# Patient Record
Sex: Female | Born: 1960
Health system: Southern US, Community
[De-identification: ages and names within clinical notes are randomized; demographics above are authoritative.]

## PROBLEM LIST (undated history)

## (undated) DIAGNOSIS — E079 Disorder of thyroid, unspecified: Secondary | ICD-10-CM

## (undated) DIAGNOSIS — F419 Anxiety disorder, unspecified: Secondary | ICD-10-CM

## (undated) DIAGNOSIS — E039 Hypothyroidism, unspecified: Secondary | ICD-10-CM

## (undated) DIAGNOSIS — F32A Depression, unspecified: Secondary | ICD-10-CM

## (undated) DIAGNOSIS — N189 Chronic kidney disease, unspecified: Secondary | ICD-10-CM

## (undated) DIAGNOSIS — F329 Major depressive disorder, single episode, unspecified: Secondary | ICD-10-CM

## (undated) DIAGNOSIS — K219 Gastro-esophageal reflux disease without esophagitis: Secondary | ICD-10-CM

## (undated) DIAGNOSIS — T7840XA Allergy, unspecified, initial encounter: Secondary | ICD-10-CM

## (undated) DIAGNOSIS — I10 Essential (primary) hypertension: Secondary | ICD-10-CM

## (undated) DIAGNOSIS — N281 Cyst of kidney, acquired: Secondary | ICD-10-CM

## (undated) DIAGNOSIS — M1990 Unspecified osteoarthritis, unspecified site: Secondary | ICD-10-CM

## (undated) HISTORY — PX: ABDOMINAL HYSTERECTOMY: SHX81

## (undated) HISTORY — PX: CHOLECYSTECTOMY: SHX55

## (undated) HISTORY — PX: JOINT REPLACEMENT: SHX530

---
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
## 1995-05-05 HISTORY — PX: BREAST BIOPSY: SHX20

## 2005-02-10 ENCOUNTER — Ambulatory Visit: Payer: Self-pay | Admitting: Family Medicine

## 2006-02-16 ENCOUNTER — Ambulatory Visit: Payer: Self-pay | Admitting: Family Medicine

## 2008-01-23 ENCOUNTER — Emergency Department: Payer: Self-pay | Admitting: Emergency Medicine

## 2008-01-23 ENCOUNTER — Other Ambulatory Visit: Payer: Self-pay

## 2008-02-15 ENCOUNTER — Ambulatory Visit: Payer: Self-pay | Admitting: Family Medicine

## 2009-02-21 ENCOUNTER — Ambulatory Visit: Payer: Self-pay | Admitting: Family Medicine

## 2011-10-05 ENCOUNTER — Ambulatory Visit: Payer: Self-pay | Admitting: Family

## 2014-08-19 ENCOUNTER — Ambulatory Visit
Admit: 2014-08-19 | Disposition: A | Discharge status: Home / Self Care | Payer: Self-pay | Attending: Family Medicine | Admitting: Family Medicine

## 2018-05-31 DIAGNOSIS — M109 Gout, unspecified: Secondary | ICD-10-CM | POA: Diagnosis not present

## 2018-05-31 DIAGNOSIS — E039 Hypothyroidism, unspecified: Secondary | ICD-10-CM | POA: Diagnosis not present

## 2018-05-31 DIAGNOSIS — Z1159 Encounter for screening for other viral diseases: Secondary | ICD-10-CM | POA: Diagnosis not present

## 2018-05-31 DIAGNOSIS — N183 Chronic kidney disease, stage 3 (moderate): Secondary | ICD-10-CM | POA: Diagnosis not present

## 2018-11-13 ENCOUNTER — Other Ambulatory Visit: Payer: Self-pay

## 2018-11-13 ENCOUNTER — Emergency Department
Admission: EM | Admit: 2018-11-13 | Discharge: 2018-11-13 | Disposition: A | Discharge status: Home / Self Care | Payer: 59 | Attending: Emergency Medicine | Admitting: Emergency Medicine

## 2018-11-13 DIAGNOSIS — E860 Dehydration: Secondary | ICD-10-CM

## 2018-11-13 DIAGNOSIS — R55 Syncope and collapse: Secondary | ICD-10-CM | POA: Diagnosis not present

## 2018-11-13 DIAGNOSIS — E039 Hypothyroidism, unspecified: Secondary | ICD-10-CM | POA: Insufficient documentation

## 2018-11-13 HISTORY — DX: Hypothyroidism, unspecified: E03.9

## 2018-11-13 LAB — CBC WITH DIFFERENTIAL/PLATELET
Abs Immature Granulocytes: 0.02 10*3/uL (ref 0.00–0.07)
Basophils Absolute: 0 10*3/uL (ref 0.0–0.1)
Basophils Relative: 1 %
Eosinophils Absolute: 0.1 10*3/uL (ref 0.0–0.5)
Eosinophils Relative: 1 %
HCT: 40.3 % (ref 36.0–46.0)
Hemoglobin: 13.5 g/dL (ref 12.0–15.0)
Immature Granulocytes: 0 %
Lymphocytes Relative: 41 %
Lymphs Abs: 2.4 10*3/uL (ref 0.7–4.0)
MCH: 31.7 pg (ref 26.0–34.0)
MCHC: 33.5 g/dL (ref 30.0–36.0)
MCV: 94.6 fL (ref 80.0–100.0)
Monocytes Absolute: 0.5 10*3/uL (ref 0.1–1.0)
Monocytes Relative: 9 %
Neutro Abs: 2.8 10*3/uL (ref 1.7–7.7)
Neutrophils Relative %: 48 %
Platelets: 245 10*3/uL (ref 150–400)
RBC: 4.26 MIL/uL (ref 3.87–5.11)
RDW: 12 % (ref 11.5–15.5)
WBC: 5.8 10*3/uL (ref 4.0–10.5)
nRBC: 0 % (ref 0.0–0.2)

## 2018-11-13 LAB — COMPREHENSIVE METABOLIC PANEL
ALT: 20 U/L (ref 0–44)
AST: 19 U/L (ref 15–41)
Albumin: 4.6 g/dL (ref 3.5–5.0)
Alkaline Phosphatase: 83 U/L (ref 38–126)
Anion gap: 6 (ref 5–15)
BUN: 27 mg/dL — ABNORMAL HIGH (ref 6–20)
CO2: 27 mmol/L (ref 22–32)
Calcium: 9.4 mg/dL (ref 8.9–10.3)
Chloride: 105 mmol/L (ref 98–111)
Creatinine, Ser: 1.41 mg/dL — ABNORMAL HIGH (ref 0.44–1.00)
GFR calc Af Amer: 48 mL/min — ABNORMAL LOW (ref >60)
GFR calc non Af Amer: 42 mL/min — ABNORMAL LOW (ref >60)
Glucose, Bld: 110 mg/dL — ABNORMAL HIGH (ref 70–99)
Potassium: 4.9 mmol/L (ref 3.5–5.1)
Sodium: 138 mmol/L (ref 135–145)
Total Bilirubin: 0.7 mg/dL (ref 0.3–1.2)
Total Protein: 7.8 g/dL (ref 6.5–8.1)

## 2018-11-13 MED ORDER — SODIUM CHLORIDE 0.9 % IV SOLN
1000.0000 mL | Freq: Once | INTRAVENOUS | Status: AC
  Administered 2018-11-13: 1000 mL via INTRAVENOUS

## 2018-11-13 NOTE — ED Notes (Signed)
Pt up to use bathroom at this time. 

## 2018-11-13 NOTE — ED Triage Notes (Addendum)
Pt was at work and had a syncopal episode. Denies being diabetic. Denies drinking or eating today. Hx hypothyroidism, HTN. Came to quickly. Landed on floor, on back- hit a chair first and then laid on floor, c/o HA and neck tightness. Denies blood thinner use. A&O at this time- back to baseline.   CBG 93.

## 2018-11-13 NOTE — Discharge Instructions (Addendum)
Please drink lots of fluids and rest today
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
## 2018-11-14 LAB — GLUCOSE, CAPILLARY: Glucose-Capillary: 93 mg/dL (ref 70–99)

## 2018-11-30 ENCOUNTER — Emergency Department
Admission: EM | Admit: 2018-11-30 | Discharge: 2018-11-30 | Disposition: A | Discharge status: Home / Self Care | Payer: 59 | Attending: Emergency Medicine | Admitting: Emergency Medicine

## 2018-11-30 ENCOUNTER — Encounter: Payer: Self-pay | Admitting: Emergency Medicine

## 2018-11-30 ENCOUNTER — Emergency Department: Payer: 59

## 2018-11-30 ENCOUNTER — Other Ambulatory Visit: Payer: Self-pay

## 2018-11-30 DIAGNOSIS — M19071 Primary osteoarthritis, right ankle and foot: Secondary | ICD-10-CM | POA: Diagnosis not present

## 2018-11-30 DIAGNOSIS — E039 Hypothyroidism, unspecified: Secondary | ICD-10-CM | POA: Diagnosis not present

## 2018-11-30 DIAGNOSIS — M7731 Calcaneal spur, right foot: Secondary | ICD-10-CM | POA: Insufficient documentation

## 2018-11-30 DIAGNOSIS — Z79899 Other long term (current) drug therapy: Secondary | ICD-10-CM | POA: Insufficient documentation

## 2018-11-30 DIAGNOSIS — M79671 Pain in right foot: Secondary | ICD-10-CM | POA: Insufficient documentation

## 2018-11-30 MED ORDER — ONDANSETRON HCL 4 MG PO TABS: 4.0000 mg | ORAL_TABLET | Freq: Three times a day (TID) | ORAL | 0 refills | Status: DC | PRN

## 2018-11-30 MED ORDER — TRAMADOL HCL 50 MG PO TABS: 50.0000 mg | ORAL_TABLET | Freq: Four times a day (QID) | ORAL | 0 refills | Status: AC | PRN

## 2018-11-30 NOTE — ED Triage Notes (Signed)
Pt presents to ED via POV with c/o R foot pain. Pt states pain worse with walking at this time, pt ambulatory with slight limp at this time. Pt states no known injury at this time.

## 2018-11-30 NOTE — ED Provider Notes (Signed)
[facility] Emergency Department Provider Note  ____________________________________________  Time seen: Approximately 5:30 PM  I have reviewed the triage vital signs and the nursing notes.   HISTORY  Chief Complaint Foot Pain    HPI Sara Hebert is a 58 y.o. female with a history of hypothyroidism, presents to the emergency department with acute lateral right foot pain that started today.  Patient states that the pain intensified after standing for prolonged amount of time.  Patient localizes her pain along the proximal aspect of the fourth and fifth metatarsals.  She denies falls or traumas.  No inversion or eversion type injuries.  Patient has a history of pretibial myxedema and states that she has chronic swelling of the right foot and right ankle that is baseline for her.  She has not noticed any new redness or worsening swelling along the right foot and right calf.  No recent surgeries, recent travel or prolonged immobilization.  She has been able to ambulate with some pain.  Patient states that she cannot take anti-inflammatories for pain.  She has been taking Tylenol at home.        Past Medical History:  Diagnosis Date  . Hypothyroidism     There are no active problems to display for this patient.   Past Surgical History:  Procedure Laterality Date  . ABDOMINAL HYSTERECTOMY    . CHOLECYSTECTOMY      Prior to Admission medications   Medication Sig Start Date End Date Taking? Authorizing Provider  atenolol (TENORMIN) 100 MG tablet Take 100 mg by mouth daily.   Yes [provider]  buPROPion (WELLBUTRIN XL) 300 MG 24 hr tablet Take 300 mg by mouth daily.   Yes [provider]  levothyroxine (SYNTHROID) 150 MCG tablet Take 150 mcg by mouth daily before breakfast.   Yes [provider]  lisinopril (ZESTRIL) 20 MG tablet Take 20 mg by mouth daily.   Yes [provider]  ondansetron (ZOFRAN) 4 MG tablet  Take 1 tablet (4 mg total) by mouth every 8 (eight) hours as needed for nausea or vomiting. 11/30/18   Orvil FeilWoods, [name] M, PA-C  traMADol (ULTRAM) 50 MG tablet Take 1 tablet (50 mg total) by mouth every 6 (six) hours as needed for up to 3 days. 11/30/18 12/03/18  Orvil FeilWoods, Lovey Crupi M, PA-C    Allergies Patient has no known allergies.  No family history on file.  Social History Social History   Tobacco Use  . Smoking status: Never Smoker  Substance Use Topics  . Alcohol use: Not Currently    Frequency: Never  . Drug use: Not on file     Review of Systems  Constitutional: No fever/chills Eyes: No visual changes. No discharge ENT: No upper respiratory complaints. Cardiovascular: no chest pain. Respiratory: no cough. No SOB. Gastrointestinal: No abdominal pain.  No nausea, no vomiting.  No diarrhea.  No constipation. Musculoskeletal: Patient has right foot pain.  Skin: Negative for rash, abrasions, lacerations, ecchymosis. Neurological: Negative for headaches, focal weakness or numbness.   ____________________________________________   PHYSICAL EXAM:  VITAL SIGNS: ED Triage Vitals  Enc Vitals Group     BP 11/30/18 1556 (!) 129/56     Pulse Rate 11/30/18 1556 91     Resp 11/30/18 1556 16     Temp 11/30/18 1556 98.2 F (36.8 C)     Temp Source 11/30/18 1556 Oral     SpO2 11/30/18 1556 99 %     Weight 11/30/18 1554 240  lb (108.9 kg)     Height 11/30/18 1554 5\' 5"  (1.651 m)     Head Circumference --      Peak Flow --      Pain Score 11/30/18 1554 5     Pain Loc --      Pain Edu? --      Excl. in [place]? --      Constitutional: Alert and oriented. Well appearing and in no acute distress. Eyes: Conjunctivae are normal. PERRL. EOMI. Head: Atraumatic. Cardiovascular: Normal rate, regular rhythm. Normal S1 and S2.  Good peripheral circulation. Respiratory: Normal respiratory effort without tachypnea or retractions. Lungs CTAB. Good air entry to the bases with no decreased or absent  breath sounds. Musculoskeletal: Full range of motion to all extremities. No gross deformities appreciated.  Patient does not have reproducible pain to palpation along the fourth and fifth metatarsals.  No pain with metatarsal compression.  Patient does experience pain with weightbearing activities.  Palpable dorsalis pedis pulse, right Neurologic:  Normal speech and language. No gross focal neurologic deficits are appreciated.  Skin:  Skin is warm, dry and intact. No rash noted. Psychiatric: Mood and affect are normal. Speech and behavior are normal. Patient exhibits appropriate insight and judgement.   ____________________________________________   LABS (all labs ordered are listed, but only abnormal results are displayed)  Labs Reviewed - No data to display ____________________________________________  EKG   ____________________________________________  RADIOLOGY I personally viewed and evaluated these images as part of my medical decision making, as well as reviewing the written report by the radiologist.  Dg Foot Complete Right  Result Date: 11/30/2018 CLINICAL DATA:  Right foot pain. No known injury. EXAM: RIGHT FOOT COMPLETE - 3+ VIEW COMPARISON:  None. FINDINGS: There is no evidence of fracture or dislocation. Minimal midfoot osteoarthritis, minimal degenerative change of the first metatarsal phalangeal joint. Prominent plantar calcaneal spur and Achilles tendon enthesophyte. No bony destructive change, osseous erosions, or periosteal reaction. Generalized soft tissue edema. Soft tissue calcifications in the included lower leg likely phleboliths. IMPRESSION: 1. Generalized soft tissue edema. No acute osseous abnormality. 2. Minor osteoarthritis in the midfoot and first metatarsal phalangeal joint. Prominent plantar calcaneal spur and Achilles tendon enthesophyte. Electronically Signed   By: Keith Rake M.D.   On: 11/30/2018 16:47     ____________________________________________    PROCEDURES  Procedure(s) performed:    Procedures    Medications - No data to display   ____________________________________________   INITIAL IMPRESSION / ASSESSMENT AND PLAN / ED COURSE  Pertinent labs & imaging results that were available during my care of the patient were reviewed by me and considered in my medical decision making (see chart for details).  Review of the [place] CSRS was performed in accordance of the [place] prior to dispensing any controlled drugs.           Assessment and plan Right foot pain 58 year old female presents to the emergency department with right foot pain along the distribution of the fourth and fifth metatarsals.  Vital signs were stable at triage.   Differential diagnosis includes osteoarthritis versus sprain.  X-ray examination revealed bony spurring along the fifth metatarsal and osteoarthritis of the midfoot.  Patient was discharged with a short course of tramadol and was referred to podiatry, Dr. Vickki Muff.  She was advised to use ice nightly.  A work note was provided.  All patient questions were answered.   ____________________________________________  FINAL CLINICAL IMPRESSION(S) / ED DIAGNOSES  Final diagnoses:  Right foot  pain      NEW MEDICATIONS STARTED DURING THIS VISIT:  ED Discharge Orders         Ordered    traMADol (ULTRAM) 50 MG tablet  Every 6 hours PRN     11/30/18 1726    ondansetron (ZOFRAN) 4 MG tablet  Every 8 hours PRN     11/30/18 1726              This chart was dictated using voice recognition software/Dragon. Despite best efforts to proofread, errors can occur which can change the meaning. Any change was purely unintentional.    Orvil FeilWoods, Mylia Pondexter M, PA-C 11/30/18 1734    Concha SeFunke, Mary E, MD 11/30/18 1745

## 2018-11-30 NOTE — ED Notes (Signed)
See triage note  Presents with pain to right foot  States pain started yesterday   Denies any injury  Is able to ambulate with slight limp

## 2019-01-11 DIAGNOSIS — F411 Generalized anxiety disorder: Secondary | ICD-10-CM | POA: Diagnosis not present

## 2019-01-11 DIAGNOSIS — Z1389 Encounter for screening for other disorder: Secondary | ICD-10-CM | POA: Diagnosis not present

## 2019-01-11 DIAGNOSIS — E039 Hypothyroidism, unspecified: Secondary | ICD-10-CM | POA: Diagnosis not present

## 2019-01-11 DIAGNOSIS — Z8639 Personal history of other endocrine, nutritional and metabolic disease: Secondary | ICD-10-CM | POA: Diagnosis not present

## 2019-01-11 DIAGNOSIS — I10 Essential (primary) hypertension: Secondary | ICD-10-CM | POA: Diagnosis not present

## 2019-01-11 DIAGNOSIS — N183 Chronic kidney disease, stage 3 (moderate): Secondary | ICD-10-CM | POA: Diagnosis not present

## 2019-01-11 DIAGNOSIS — Z1231 Encounter for screening mammogram for malignant neoplasm of breast: Secondary | ICD-10-CM | POA: Diagnosis not present

## 2019-01-12 DIAGNOSIS — Z1231 Encounter for screening mammogram for malignant neoplasm of breast: Secondary | ICD-10-CM | POA: Diagnosis not present

## 2019-01-12 DIAGNOSIS — N183 Chronic kidney disease, stage 3 (moderate): Secondary | ICD-10-CM | POA: Diagnosis not present

## 2019-01-12 DIAGNOSIS — I10 Essential (primary) hypertension: Secondary | ICD-10-CM | POA: Diagnosis not present

## 2019-01-12 DIAGNOSIS — Z8639 Personal history of other endocrine, nutritional and metabolic disease: Secondary | ICD-10-CM | POA: Diagnosis not present

## 2019-01-12 DIAGNOSIS — E039 Hypothyroidism, unspecified: Secondary | ICD-10-CM | POA: Diagnosis not present

## 2019-02-23 DIAGNOSIS — L049 Acute lymphadenitis, unspecified: Secondary | ICD-10-CM | POA: Diagnosis not present

## 2019-04-10 ENCOUNTER — Other Ambulatory Visit: Payer: Self-pay

## 2019-04-10 ENCOUNTER — Encounter: Payer: Self-pay | Admitting: Emergency Medicine

## 2019-04-10 ENCOUNTER — Ambulatory Visit
Admission: EM | Admit: 2019-04-10 | Discharge: 2019-04-10 | Disposition: A | Discharge status: Home / Self Care | Payer: 59 | Attending: Family Medicine | Admitting: Family Medicine

## 2019-04-10 DIAGNOSIS — M25562 Pain in left knee: Secondary | ICD-10-CM

## 2019-04-10 MED ORDER — PREDNISONE 20 MG PO TABS: ORAL_TABLET | ORAL | 0 refills | Status: DC

## 2019-04-10 MED ORDER — HYDROCODONE-ACETAMINOPHEN 5-325 MG PO TABS: ORAL_TABLET | ORAL | 0 refills | Status: DC

## 2019-04-10 NOTE — ED Triage Notes (Signed)
Pt c/o left knee pain. Started about a week ago. No known injury. 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

## 2019-04-10 NOTE — ED Provider Notes (Signed)
[facility]    CSN: [id number] Arrival date & time: 04/10/19  1716      History   Chief Complaint Chief Complaint  Patient presents with  . Knee Pain    left    HPI Sara Hebert is a 58 y.o. female.   58 yo female with a c/o left knee pain and mild swelling for the past week. Denies any redness, rash, fevers, chills, falls, twisting injury or other traumatic injury.  States she has a h/o osteoarthritis.    Knee Pain   Past Medical History:  Diagnosis Date  . Hypothyroidism     There are no active problems to display for this patient.   Past Surgical History:  Procedure Laterality Date  . ABDOMINAL HYSTERECTOMY    . CHOLECYSTECTOMY      OB History   No obstetric history on file.      Home Medications    Prior to Admission medications   Medication Sig Start Date End Date Taking? Authorizing Provider  buPROPion (WELLBUTRIN XL) 300 MG 24 hr tablet Take 300 mg by mouth daily.   Yes [provider]  levothyroxine (SYNTHROID) 150 MCG tablet Take 150 mcg by mouth daily before breakfast.   Yes [provider]  lisinopril (ZESTRIL) 20 MG tablet Take 5 mg by mouth daily.    Yes [provider]  atenolol (TENORMIN) 100 MG tablet Take 100 mg by mouth daily.    [provider]  citalopram (CELEXA) 40 MG tablet Take 40 mg by mouth daily. 01/11/19   [provider]  HYDROcodone-acetaminophen (NORCO/VICODIN) 5-325 MG tablet 1-2 tabs po qd prn 04/10/19   Payton Mccallum, MD  hydrOXYzine (ATARAX/VISTARIL) 50 MG tablet Take 50 mg by mouth 3 (three) times daily as needed. 02/20/19   [provider]  ondansetron (ZOFRAN) 4 MG tablet Take 1 tablet (4 mg total) by mouth every 8 (eight) hours as needed for nausea or vomiting. 11/30/18   Orvil Feil, PA-C  predniSONE (DELTASONE) 20 MG tablet 2 tabs po qd 04/10/19   Payton Mccallum, MD    Family History Family History  Problem Relation Age of Onset  .  Kidney failure Mother   . Healthy Father     Social History Social History   Tobacco Use  . Smoking status: Never Smoker  . Smokeless tobacco: Never Used  Substance Use Topics  . Alcohol use: Not Currently    Frequency: Never  . Drug use: Not on file     Allergies   Patient has no known allergies.   Review of Systems Review of Systems   Physical Exam Triage Vital Signs ED Triage Vitals  Enc Vitals Group     BP 04/10/19 1733 (!) 149/73     Pulse Rate 04/10/19 1733 97     Resp 04/10/19 1733 18     Temp 04/10/19 1733 98.9 F (37.2 C)     Temp Source 04/10/19 1733 Oral     SpO2 04/10/19 1733 98 %     Weight 04/10/19 1730 250 lb (113.4 kg)     Height 04/10/19 1730 5\' 5"  (1.651 m)     Head Circumference --      Peak Flow --      Pain Score 04/10/19 1729 7     Pain Loc --      Pain Edu? --      Excl. in [place]? --    No data found.  Updated Vital Signs BP 14/07/20)  149/73 (BP Location: Left Arm)   Pulse 97   Temp 98.9 F (37.2 C) (Oral)   Resp 18   Ht 5\' 5"  (1.651 m)   Wt 113.4 kg   SpO2 98%   BMI 41.60 kg/m   Visual Acuity Right Eye Distance:   Left Eye Distance:   Bilateral Distance:    Right Eye Near:   Left Eye Near:    Bilateral Near:     Physical Exam Vitals signs and nursing note reviewed.  Constitutional:      General: She is not in acute distress.    Appearance: She is not toxic-appearing or diaphoretic.  Musculoskeletal:     Left knee: She exhibits swelling (mild). She exhibits normal range of motion, no effusion, no ecchymosis, no deformity, no laceration, no erythema, normal alignment, no LCL laxity, normal patellar mobility, normal meniscus and no MCL laxity. Tenderness found. LCL tenderness noted. No medial joint line, no lateral joint line, no MCL and no patellar tendon tenderness noted.  Neurological:     Mental Status: She is alert.      UC Treatments / Results  Labs (all labs ordered are listed, but only abnormal results are  displayed) Labs Reviewed - No data to display  EKG   Radiology No results found.  Procedures Procedures (including critical care time)  Medications Ordered in UC Medications - No data to display  Initial Impression / Assessment and Plan / UC Course  I have reviewed the triage vital signs and the nursing notes.  Pertinent labs & imaging results that were available during my care of the patient were reviewed by me and considered in my medical decision making (see chart for details).      Final Clinical Impressions(s) / UC Diagnoses   Final diagnoses:  Acute pain of left knee     Discharge Instructions     Rest, ice, elevation, tylenol    ED Prescriptions    Medication Sig Dispense Auth. Provider   predniSONE (DELTASONE) 20 MG tablet 2 tabs po qd 10 tablet Norval Gable, MD   HYDROcodone-acetaminophen (NORCO/VICODIN) 5-325 MG tablet 1-2 tabs po qd prn 8 tablet Norval Gable, MD      1. diagnosis reviewed with patient 2. rx as per orders above; reviewed possible side effects, interactions, risks and benefits  3. Recommend supportive treatment as above 4. Follow-up prn if symptoms worsen or don't improve   I have reviewed the PDMP during this encounter.   Norval Gable, MD 04/10/19 [phone]

## 2019-04-10 NOTE — Discharge Instructions (Addendum)
Rest, ice, elevation, tylenol °

## 2019-04-13 ENCOUNTER — Other Ambulatory Visit: Payer: Self-pay

## 2019-04-13 ENCOUNTER — Emergency Department: Payer: 59

## 2019-04-13 ENCOUNTER — Emergency Department
Admission: EM | Admit: 2019-04-13 | Discharge: 2019-04-13 | Disposition: A | Discharge status: Home / Self Care | Payer: 59 | Attending: Emergency Medicine | Admitting: Emergency Medicine

## 2019-04-13 DIAGNOSIS — Z79899 Other long term (current) drug therapy: Secondary | ICD-10-CM | POA: Insufficient documentation

## 2019-04-13 DIAGNOSIS — M25562 Pain in left knee: Secondary | ICD-10-CM | POA: Insufficient documentation

## 2019-04-13 DIAGNOSIS — M170 Bilateral primary osteoarthritis of knee: Secondary | ICD-10-CM | POA: Diagnosis not present

## 2019-04-13 DIAGNOSIS — E039 Hypothyroidism, unspecified: Secondary | ICD-10-CM | POA: Diagnosis not present

## 2019-04-13 DIAGNOSIS — R52 Pain, unspecified: Secondary | ICD-10-CM | POA: Diagnosis not present

## 2019-04-13 DIAGNOSIS — I10 Essential (primary) hypertension: Secondary | ICD-10-CM | POA: Diagnosis not present

## 2019-04-13 DIAGNOSIS — R5381 Other malaise: Secondary | ICD-10-CM | POA: Diagnosis not present

## 2019-04-13 DIAGNOSIS — M1712 Unilateral primary osteoarthritis, left knee: Secondary | ICD-10-CM | POA: Diagnosis not present

## 2019-04-13 MED ORDER — OXYCODONE-ACETAMINOPHEN 7.5-325 MG PO TABS: 1.0000 | ORAL_TABLET | Freq: Four times a day (QID) | ORAL | 0 refills | Status: DC | PRN

## 2019-04-13 NOTE — ED Provider Notes (Signed)
[facility] Emergency Department Provider Note   ____________________________________________   First MD Initiated Contact with Patient 04/13/19 [phone]     (approximate)  I have reviewed the triage vital signs and the nursing notes.   HISTORY  Chief Complaint Knee Pain    HPI Sara Hebert is a 58 y.o. female patient presents with nontraumatic left knee pain for 4 days.  Patient was seen in urgent care 2 days ago and was told she had fluid on her knee and given Vicodin and prednisone.  Patient no relief with these medications.  Patient state pain is a constant 2/10 but increases to a 6/10 with weightbearing and ambulation.  Patient's chronic pain is "achy".         Past Medical History:  Diagnosis Date  . Hypothyroidism     There are no problems to display for this patient.   Past Surgical History:  Procedure Laterality Date  . ABDOMINAL HYSTERECTOMY    . CHOLECYSTECTOMY      Prior to Admission medications   Medication Sig Start Date End Date Taking? Authorizing Provider  atenolol (TENORMIN) 100 MG tablet Take 100 mg by mouth daily.    [provider]  buPROPion (WELLBUTRIN XL) 300 MG 24 hr tablet Take 300 mg by mouth daily.    [provider]  citalopram (CELEXA) 40 MG tablet Take 40 mg by mouth daily. 01/11/19   [provider]  HYDROcodone-acetaminophen (NORCO/VICODIN) 5-325 MG tablet 1-2 tabs po qd prn 04/10/19   Norval Gable, MD  hydrOXYzine (ATARAX/VISTARIL) 50 MG tablet Take 50 mg by mouth 3 (three) times daily as needed. 02/20/19   [provider]  levothyroxine (SYNTHROID) 150 MCG tablet Take 150 mcg by mouth daily before breakfast.    [provider]  lisinopril (ZESTRIL) 20 MG tablet Take 5 mg by mouth daily.     [provider]  ondansetron (ZOFRAN) 4 MG tablet Take 1 tablet (4 mg total) by mouth every 8 (eight) hours as needed for nausea or vomiting. 11/30/18   Lannie Fields, PA-C  oxyCODONE-acetaminophen (PERCOCET) 7.5-325 MG tablet Take 1 tablet by mouth every 6 (six) hours as needed. 04/13/19   Sable Feil, PA-C  predniSONE (DELTASONE) 20 MG tablet 2 tabs po qd 04/10/19   Norval Gable, MD    Allergies Patient has no known allergies.  Family History  Problem Relation Age of Onset  . Kidney failure Mother   . Healthy Father     Social History Social History   Tobacco Use  . Smoking status: Never Smoker  . Smokeless tobacco: Never Used  Substance Use Topics  . Alcohol use: Not Currently  . Drug use: Not on file    Review of Systems  Constitutional: No fever/chills Eyes: No visual changes. ENT: No sore throat. Cardiovascular: Denies chest pain. Respiratory: Denies shortness of breath. Gastrointestinal: No abdominal pain.  No nausea, no vomiting.  No diarrhea.  No constipation. Genitourinary: Negative for dysuria. Musculoskeletal: Left knee pain.   Skin: Negative for rash. Neurological: Negative for headaches, focal weakness or numbness. Endocrine:  Hypothyroidism. ____________________________________________   PHYSICAL EXAM:  VITAL SIGNS: ED Triage Vitals  Enc Vitals Group     BP 04/13/19 0550 137/72     Pulse Rate 04/13/19 0550 99     Resp 04/13/19 0550 20     Temp 04/13/19 0550 99.7 F (37.6 C)     Temp Source 04/13/19 0550 Oral     SpO2  04/13/19 0550 97 %     Weight 04/13/19 0548 250 lb (113.4 kg)     Height 04/13/19 0548 5\' 5"  (1.651 m)     Head Circumference --      Peak Flow --      Pain Score 04/13/19 0548 2     Pain Loc --      Pain Edu? --      Excl. in GC? --     Constitutional: Alert and oriented. Well appearing and in no acute distress. Cardiovascular: Normal rate, regular rhythm. Grossly normal heart sounds.  Good peripheral circulation. Respiratory: Normal respiratory effort.  No retractions. Lungs CTAB. Musculoskeletal: No lower extremity tenderness nor edema.  No joint effusions. Neurologic:   Normal speech and language. No gross focal neurologic deficits are appreciated. No gait instability. Skin:  Skin is warm, dry and intact. No rash noted. Psychiatric: Mood and affect are normal. Speech and behavior are normal.  ____________________________________________   LABS (all labs ordered are listed, but only abnormal results are displayed)  Labs Reviewed - No data to display ____________________________________________  EKG   ____________________________________________  RADIOLOGY  ED MD interpretation:    Official radiology report(s): DG Knee Complete 4 Views Left  Result Date: 04/13/2019 CLINICAL DATA:  Left knee pain.  No injury. EXAM: LEFT KNEE - COMPLETE 4+ VIEW COMPARISON:  No recent. FINDINGS: No acute bony or joint abnormality identified. Mild medial compartment and patellofemoral degenerative change. Tiny loose bodies cannot be excluded. No evidence of fracture or dislocation. No significant effusion noted. IMPRESSION: Mild medial compartment and patellofemoral degenerative change. Tiny loose bodies cannot be excluded. Electronically Signed   By: 14/02/2019  Register   On: 04/13/2019 08:03    ____________________________________________   PROCEDURES  Procedure(s) performed (including Critical Care):  Procedures   ____________________________________________   INITIAL IMPRESSION / ASSESSMENT AND PLAN / ED COURSE  As part of my medical decision making, I reviewed the following data within the electronic MEDICAL RECORD NUMBER     Left knee pain secondary to degenerative changes.  Discussed x-ray findings with patient.  Patient placed in a knee immobilizer and given crutches to assist with ambulation.  Patient follow-up with orthopedics for definitive evaluation and treatment.  Take medication as directed.    [name] was evaluated in Emergency Department on 04/13/2019 for the symptoms described in the history of present illness. She was evaluated in  the context of the global COVID-19 pandemic, which necessitated consideration that the patient might be at risk for infection with the SARS-CoV-2 virus that causes COVID-19. Institutional protocols and algorithms that pertain to the evaluation of patients at risk for COVID-19 are in a state of rapid change based on information released by regulatory bodies including the CDC and federal and state organizations. These policies and algorithms were followed during the patient's care in the ED.       ____________________________________________   FINAL CLINICAL IMPRESSION(S) / ED DIAGNOSES  Final diagnoses:  Acute pain of left knee     ED Discharge Orders         Ordered    oxyCODONE-acetaminophen (PERCOCET) 7.5-325 MG tablet  Every 6 hours PRN     04/13/19 0823           Note:  This document was prepared using Dragon voice recognition software and may include unintentional dictation errors.    14/10/20, PA-C 04/13/19 14/10/20    [id number], MD 04/14/19 [phone]

## 2019-04-13 NOTE — ED Notes (Signed)
See triage note  Presents with pain and swelling to left knee   States she was seen at [facility] this week.  Placed on steroids and pain meds  States unable to bear wt

## 2019-04-13 NOTE — ED Triage Notes (Signed)
Pt in with co left knee pain for few days. Did go to urgent care on Monday and dx with "fluid in the knee". Was given prednisone and vicodin without relief. Pt denies any injury hx of osteo arthritis.

## 2019-04-19 DIAGNOSIS — M170 Bilateral primary osteoarthritis of knee: Secondary | ICD-10-CM | POA: Diagnosis not present

## 2019-06-26 ENCOUNTER — Other Ambulatory Visit: Payer: Self-pay | Admitting: Orthopedic Surgery

## 2019-06-26 DIAGNOSIS — M1711 Unilateral primary osteoarthritis, right knee: Secondary | ICD-10-CM

## 2019-06-27 ENCOUNTER — Ambulatory Visit
Admission: RE | Admit: 2019-06-27 | Discharge: 2019-06-27 | Disposition: A | Discharge status: Home / Self Care | Payer: No Typology Code available for payment source | Source: Ambulatory Visit | Attending: Orthopedic Surgery | Admitting: Orthopedic Surgery

## 2019-06-27 ENCOUNTER — Other Ambulatory Visit: Payer: Self-pay

## 2019-06-27 DIAGNOSIS — M1711 Unilateral primary osteoarthritis, right knee: Secondary | ICD-10-CM | POA: Insufficient documentation

## 2019-07-07 ENCOUNTER — Other Ambulatory Visit: Payer: Self-pay | Admitting: Internal Medicine

## 2019-07-24 ENCOUNTER — Other Ambulatory Visit: Payer: Self-pay | Admitting: Orthopedic Surgery

## 2019-07-28 ENCOUNTER — Other Ambulatory Visit: Payer: Self-pay

## 2019-07-28 ENCOUNTER — Encounter
Admission: RE | Admit: 2019-07-28 | Discharge: 2019-07-28 | Disposition: A | Discharge status: Home / Self Care | Payer: No Typology Code available for payment source | Source: Ambulatory Visit | Attending: Orthopedic Surgery | Admitting: Orthopedic Surgery

## 2019-07-28 DIAGNOSIS — Z01818 Encounter for other preprocedural examination: Secondary | ICD-10-CM | POA: Diagnosis not present

## 2019-07-28 HISTORY — DX: Chronic kidney disease, unspecified: N18.9

## 2019-07-28 HISTORY — DX: Anxiety disorder, unspecified: F41.9

## 2019-07-28 HISTORY — DX: Essential (primary) hypertension: I10

## 2019-07-28 HISTORY — DX: Unspecified osteoarthritis, unspecified site: M19.90

## 2019-07-28 HISTORY — DX: Depression, unspecified: F32.A

## 2019-07-28 NOTE — Patient Instructions (Signed)
INSTRUCTIONS FOR SURGERY     Your surgery is scheduled for:   Thursday, April 1ST     To find out your arrival time for the day of surgery,          please call [phone] between 1 pm and 3 pm on :  Wednesday, MARCH 31ST     When you arrive for surgery, report to the SECOND FLOOR OF THE MEDICAL MALL.       Do NOT stop on the first floor to register.    REMEMBER: Instructions that are not followed completely may result in serious medical risk,  up to and including death, or upon the discretion of your surgeon and anesthesiologist,            your surgery may need to be rescheduled.  __X__ 1. Do not eat food after midnight the night before your procedure.                    No gum, candy, lozenger, tic tacs, tums or hard candies.                  ABSOLUTELY NOTHING SOLID IN YOUR MOUTH AFTER MIDNIGHT                    You may drink unlimited clear liquids up to 2 hours before you are scheduled to arrive for surgery.                   Do not drink anything within those 2 hours unless you need to take medicine, then take the                   smallest amount you need.  Clear liquids include:  water, apple juice without pulp,                   any flavor Gatorade, Black coffee, black tea.  Sugar may be added but no dairy/ honey /lemon.                        Broth and jello is not considered a clear liquid.  __x__  2. On the morning of surgery, please brush your teeth with toothpaste and water. You may rinse with                  mouthwash if you wish but DO NOT SWALLOW TOOTHPASTE OR MOUTHWASH  __X___3. NO alcohol for 24 hours before or after surgery.  __x___ 4.  Do NOT smoke or use e-cigarettes for 24 HOURS PRIOR TO SURGERY.                      DO NOT Use any chewable tobacco products for at least 6 hours prior to surgery.  __x___ 5. If you start any new medication after this appointment and prior to surgery, please                    Bring it with you on the day of surgery.  ___x__ 6. Notify your doctor if there is any change in  your medical condition, such as fever,                  infection, vomitting, diarrhea or any open sores.  __x___ 7.  USE the CHG SOAP as instructed, the night before surgery and the day of surgery.                   Once you have washed with this soap, do NOT use any of the following: Powders, perfumes                    or lotions. Please do not wear make up, hairpins, clips or nail polish. You MAY wear deodorant.                   Men may shave their face and neck.  Women need to shave 48 hours prior to surgery.                   DO NOT wear ANY jewelry on the day of surgery. If there are rings that are too tight to                    remove easily, please address this prior to the surgery day. Piercings need to be removed.                                                                     NO METAL ON YOUR BODY.                    Do NOT bring any valuables.  If you came to Pre-Admit testing then you will not need license,                     insurance card or credit card.  If you will be staying overnight, please either leave your things in                     the car or have your family be responsible for these items.                     [facility] IS NOT RESPONSIBLE FOR BELONGINGS OR VALUABLES.  ___X__ 8. DO NOT wear contact lenses on surgery day.  You may not have dentures,                     Hearing aides, contacts or glasses in the operating room. These items can be                    Placed in the Recovery Room to receive immediately after surgery.  __x___ 9. IF YOU ARE SCHEDULED TO GO HOME ON THE SAME DAY, YOU MUST                   Have someone to drive you home and to stay with you  for the first 24 hours.                    Have an arrangement prior to arriving on surgery day.  ___x__ 10. Take the following medications on the  morning of surgery with a sip of water:                               1. WELLBUTRIN                     2. CELEXA                     3. SYNTHROID                     4. COLCHICINE, if needed                     5.                       __x___ 11.  Follow any instructions provided to you by your surgeon.                        Such as enema, clear liquid bowel prep                        ##PLEASE DRINK THE PRESURGICAL CARB DRINK ON THE MORNING                           OF SURGERY. HAVE IT COMPLETED BY 2 HOURS PRIOR TO ARRIVAL.  __X__  12. STOP ALL ASPIRIN PRODUCTS AS OF MARCH 26TH                        THIS INCLUDES BC POWDERS / GOODIES POWDER  __x___ 13. STOP Anti-inflammatories as of:   TODAY, MARCH 26TH                      This includes IBUPROFEN / MOTRIN / ADVIL / ALEVE/ NAPROXYN                    YOU MAY TAKE TYLENOL ANY TIME PRIOR TO SURGERY.  ___X__17.  Continue to take the following medications but do not take on the morning of surgery:                        LISINOPRIL  __x____18. If staying overnight, please have appropriate shoes to wear to be able to walk around the unit.                   Wear clean and comfortable clothing to the hospital.  IF YOU HAVE A CELL PHONE , [place].

## 2019-08-01 ENCOUNTER — Other Ambulatory Visit: Payer: Self-pay

## 2019-08-01 ENCOUNTER — Other Ambulatory Visit
Admission: RE | Admit: 2019-08-01 | Discharge: 2019-08-01 | Disposition: A | Discharge status: Home / Self Care | Payer: No Typology Code available for payment source | Source: Ambulatory Visit | Attending: Orthopedic Surgery | Admitting: Orthopedic Surgery

## 2019-08-01 LAB — CBC WITH DIFFERENTIAL/PLATELET
Abs Immature Granulocytes: 0.05 10*3/uL (ref 0.00–0.07)
Basophils Absolute: 0.1 10*3/uL (ref 0.0–0.1)
Basophils Relative: 1 %
Eosinophils Absolute: 0.1 10*3/uL (ref 0.0–0.5)
Eosinophils Relative: 2 %
HCT: 39.8 % (ref 36.0–46.0)
Hemoglobin: 13.2 g/dL (ref 12.0–15.0)
Immature Granulocytes: 1 %
Lymphocytes Relative: 32 %
Lymphs Abs: 2 10*3/uL (ref 0.7–4.0)
MCH: 31.3 pg (ref 26.0–34.0)
MCHC: 33.2 g/dL (ref 30.0–36.0)
MCV: 94.3 fL (ref 80.0–100.0)
Monocytes Absolute: 0.4 10*3/uL (ref 0.1–1.0)
Monocytes Relative: 7 %
Neutro Abs: 3.8 10*3/uL (ref 1.7–7.7)
Neutrophils Relative %: 57 %
Platelets: 260 10*3/uL (ref 150–400)
RBC: 4.22 MIL/uL (ref 3.87–5.11)
RDW: 12.6 % (ref 11.5–15.5)
WBC: 6.5 10*3/uL (ref 4.0–10.5)
nRBC: 0 % (ref 0.0–0.2)

## 2019-08-01 LAB — URINALYSIS, ROUTINE W REFLEX MICROSCOPIC
Bilirubin Urine: NEGATIVE
Glucose, UA: NEGATIVE mg/dL
Ketones, ur: NEGATIVE mg/dL
Leukocytes,Ua: NEGATIVE
Nitrite: NEGATIVE
Protein, ur: NEGATIVE mg/dL
Specific Gravity, Urine: 1.004 — ABNORMAL LOW (ref 1.005–1.030)
Squamous Epithelial / HPF: NONE SEEN (ref 0–5)
pH: 6 (ref 5.0–8.0)

## 2019-08-01 LAB — COMPREHENSIVE METABOLIC PANEL
ALT: 24 U/L (ref 0–44)
AST: 21 U/L (ref 15–41)
Albumin: 4.2 g/dL (ref 3.5–5.0)
Alkaline Phosphatase: 102 U/L (ref 38–126)
Anion gap: 11 (ref 5–15)
BUN: 22 mg/dL — ABNORMAL HIGH (ref 6–20)
CO2: 25 mmol/L (ref 22–32)
Calcium: 9.3 mg/dL (ref 8.9–10.3)
Chloride: 100 mmol/L (ref 98–111)
Creatinine, Ser: 1.07 mg/dL — ABNORMAL HIGH (ref 0.44–1.00)
GFR calc Af Amer: >60 mL/min (ref >60)
GFR calc non Af Amer: 58 mL/min — ABNORMAL LOW (ref >60)
Glucose, Bld: 110 mg/dL — ABNORMAL HIGH (ref 70–99)
Potassium: 4.1 mmol/L (ref 3.5–5.1)
Sodium: 136 mmol/L (ref 135–145)
Total Bilirubin: 0.9 mg/dL (ref 0.3–1.2)
Total Protein: 7.6 g/dL (ref 6.5–8.1)

## 2019-08-01 LAB — TYPE AND SCREEN
ABO/RH(D): O POS
Antibody Screen: NEGATIVE

## 2019-08-01 LAB — SARS CORONAVIRUS 2 (TAT 6-24 HRS): SARS Coronavirus 2: NEGATIVE

## 2019-08-01 LAB — SURGICAL PCR SCREEN
MRSA, PCR: NEGATIVE
Staphylococcus aureus: NEGATIVE

## 2019-08-01 NOTE — Pre-Procedure Instructions (Signed)
Pre-Admit Testing Provider Notification Note  Provider Notified: Menz  Notification Mode: Fax  Reason: Abnormal UA Result  Response: Fax confirmation received.  Additional Information: Placed on chart. Noted on Pre-Admit Worksheet.  Signed: Alvester Morin, RN

## 2019-08-03 ENCOUNTER — Encounter: Payer: Self-pay | Admitting: Orthopedic Surgery

## 2019-08-03 ENCOUNTER — Inpatient Hospital Stay: Payer: No Typology Code available for payment source

## 2019-08-03 ENCOUNTER — Encounter
Admission: RE | Disposition: A | Discharge status: Home / Self Care | Payer: Self-pay | Source: Home / Self Care | Attending: Orthopedic Surgery

## 2019-08-03 ENCOUNTER — Inpatient Hospital Stay
Admission: RE | Admit: 2019-08-03 | Discharge: 2019-08-05 | DRG: 470 | Disposition: A | Discharge status: Home Health | Payer: No Typology Code available for payment source | Attending: Orthopedic Surgery | Admitting: Orthopedic Surgery

## 2019-08-03 ENCOUNTER — Other Ambulatory Visit: Payer: Self-pay

## 2019-08-03 DIAGNOSIS — M1711 Unilateral primary osteoarthritis, right knee: Principal | ICD-10-CM | POA: Diagnosis present

## 2019-08-03 DIAGNOSIS — N189 Chronic kidney disease, unspecified: Secondary | ICD-10-CM | POA: Diagnosis present

## 2019-08-03 DIAGNOSIS — Z79890 Hormone replacement therapy (postmenopausal): Secondary | ICD-10-CM

## 2019-08-03 DIAGNOSIS — Z9049 Acquired absence of other specified parts of digestive tract: Secondary | ICD-10-CM

## 2019-08-03 DIAGNOSIS — I129 Hypertensive chronic kidney disease with stage 1 through stage 4 chronic kidney disease, or unspecified chronic kidney disease: Secondary | ICD-10-CM | POA: Diagnosis present

## 2019-08-03 DIAGNOSIS — E039 Hypothyroidism, unspecified: Secondary | ICD-10-CM | POA: Diagnosis present

## 2019-08-03 DIAGNOSIS — Z20822 Contact with and (suspected) exposure to covid-19: Secondary | ICD-10-CM | POA: Diagnosis present

## 2019-08-03 DIAGNOSIS — Z90710 Acquired absence of both cervix and uterus: Secondary | ICD-10-CM

## 2019-08-03 DIAGNOSIS — Z6841 Body Mass Index (BMI) 40.0 and over, adult: Secondary | ICD-10-CM | POA: Diagnosis not present

## 2019-08-03 DIAGNOSIS — Z79899 Other long term (current) drug therapy: Secondary | ICD-10-CM | POA: Diagnosis not present

## 2019-08-03 DIAGNOSIS — G8918 Other acute postprocedural pain: Secondary | ICD-10-CM

## 2019-08-03 DIAGNOSIS — Z96651 Presence of right artificial knee joint: Secondary | ICD-10-CM

## 2019-08-03 HISTORY — PX: TOTAL KNEE ARTHROPLASTY: SHX125

## 2019-08-03 LAB — ABO/RH: ABO/RH(D): O POS

## 2019-08-03 LAB — CBC
HCT: 37.4 % (ref 36.0–46.0)
Hemoglobin: 12.2 g/dL (ref 12.0–15.0)
MCH: 31.2 pg (ref 26.0–34.0)
MCHC: 32.6 g/dL (ref 30.0–36.0)
MCV: 95.7 fL (ref 80.0–100.0)
Platelets: 228 10*3/uL (ref 150–400)
RBC: 3.91 MIL/uL (ref 3.87–5.11)
RDW: 12.7 % (ref 11.5–15.5)
WBC: 10.4 10*3/uL (ref 4.0–10.5)
nRBC: 0 % (ref 0.0–0.2)

## 2019-08-03 LAB — CREATININE, SERUM
Creatinine, Ser: 1.09 mg/dL — ABNORMAL HIGH (ref 0.44–1.00)
GFR calc Af Amer: >60 mL/min (ref >60)
GFR calc non Af Amer: 57 mL/min — ABNORMAL LOW (ref >60)

## 2019-08-03 SURGERY — ARTHROPLASTY, KNEE, TOTAL
Anesthesia: General | Site: Knee | Laterality: Right

## 2019-08-03 MED ORDER — LIDOCAINE HCL URETHRAL/MUCOSAL 2 % EX GEL
CUTANEOUS | Status: AC
  Filled 2019-08-03: qty 5

## 2019-08-03 MED ORDER — BUPIVACAINE HCL (PF) 0.5 % IJ SOLN
INTRAMUSCULAR | Status: AC
  Filled 2019-08-03: qty 10

## 2019-08-03 MED ORDER — PHENYLEPHRINE HCL-NACL 10-0.9 MG/250ML-% IV SOLN
INTRAVENOUS | Status: DC | PRN
  Administered 2019-08-03: 25 ug/min via INTRAVENOUS

## 2019-08-03 MED ORDER — NEOMYCIN-POLYMYXIN B GU 40-200000 IR SOLN
Status: DC | PRN
  Administered 2019-08-03: 16 mL

## 2019-08-03 MED ORDER — ENOXAPARIN SODIUM 30 MG/0.3ML ~~LOC~~ SOLN
30.0000 mg | Freq: Two times a day (BID) | SUBCUTANEOUS | Status: DC
  Administered 2019-08-04 – 2019-08-05 (×3): 30 mg via SUBCUTANEOUS
  Filled 2019-08-03 (×3): qty 0.3

## 2019-08-03 MED ORDER — BISACODYL 10 MG RE SUPP: 10.0000 mg | Freq: Every day | RECTAL | Status: DC | PRN

## 2019-08-03 MED ORDER — PROPOFOL 10 MG/ML IV BOLUS
INTRAVENOUS | Status: DC | PRN
  Administered 2019-08-03: 30 mg via INTRAVENOUS

## 2019-08-03 MED ORDER — PHENYLEPHRINE HCL (PRESSORS) 10 MG/ML IV SOLN
INTRAVENOUS | Status: AC
  Filled 2019-08-03: qty 1

## 2019-08-03 MED ORDER — CEFAZOLIN SODIUM-DEXTROSE 2-4 GM/100ML-% IV SOLN
INTRAVENOUS | Status: AC
  Administered 2019-08-03: 2 g via INTRAVENOUS
  Filled 2019-08-03: qty 100

## 2019-08-03 MED ORDER — BUPROPION HCL ER (XL) 150 MG PO TB24
300.0000 mg | ORAL_TABLET | Freq: Every day | ORAL | Status: DC
  Administered 2019-08-04 – 2019-08-05 (×2): 300 mg via ORAL
  Filled 2019-08-03 (×2): qty 2

## 2019-08-03 MED ORDER — SODIUM CHLORIDE 0.9 % IV SOLN
INTRAVENOUS | Status: DC | PRN
  Administered 2019-08-03: 09:00:00 60 mL

## 2019-08-03 MED ORDER — ZOLPIDEM TARTRATE 5 MG PO TABS: 5.0000 mg | ORAL_TABLET | Freq: Every evening | ORAL | Status: DC | PRN

## 2019-08-03 MED ORDER — MENTHOL 3 MG MT LOZG
1.0000 | LOZENGE | OROMUCOSAL | Status: DC | PRN
  Filled 2019-08-03: qty 9

## 2019-08-03 MED ORDER — OXYCODONE HCL 5 MG/5ML PO SOLN: 5.0000 mg | Freq: Once | ORAL | Status: DC | PRN

## 2019-08-03 MED ORDER — BUPIVACAINE HCL (PF) 0.5 % IJ SOLN
INTRAMUSCULAR | Status: DC | PRN
  Administered 2019-08-03: 3 mL

## 2019-08-03 MED ORDER — SODIUM CHLORIDE FLUSH 0.9 % IV SOLN
INTRAVENOUS | Status: AC
  Filled 2019-08-03: qty 20

## 2019-08-03 MED ORDER — CHLORHEXIDINE GLUCONATE CLOTH 2 % EX PADS
6.0000 | MEDICATED_PAD | Freq: Every day | CUTANEOUS | Status: DC
  Administered 2019-08-03: 6 via TOPICAL

## 2019-08-03 MED ORDER — BUPIVACAINE-EPINEPHRINE (PF) 0.25% -1:200000 IJ SOLN
INTRAMUSCULAR | Status: DC | PRN
  Administered 2019-08-03: 30 mL via PERINEURAL

## 2019-08-03 MED ORDER — PROPOFOL 10 MG/ML IV BOLUS
INTRAVENOUS | Status: AC
  Filled 2019-08-03: qty 20

## 2019-08-03 MED ORDER — OXYCODONE HCL 5 MG PO TABS
5.0000 mg | ORAL_TABLET | ORAL | Status: DC | PRN
  Administered 2019-08-04: 5 mg via ORAL
  Filled 2019-08-03: qty 1

## 2019-08-03 MED ORDER — PHENYLEPHRINE HCL (PRESSORS) 10 MG/ML IV SOLN
INTRAVENOUS | Status: DC | PRN
  Administered 2019-08-03 (×5): 100 ug via INTRAVENOUS
  Administered 2019-08-03 (×3): 200 ug via INTRAVENOUS

## 2019-08-03 MED ORDER — NEOMYCIN-POLYMYXIN B GU 40-200000 IR SOLN
Status: AC
  Filled 2019-08-03: qty 1

## 2019-08-03 MED ORDER — EPHEDRINE SULFATE 50 MG/ML IJ SOLN
INTRAMUSCULAR | Status: DC | PRN
  Administered 2019-08-03 (×2): 10 mg via INTRAVENOUS

## 2019-08-03 MED ORDER — ACETAMINOPHEN 325 MG PO TABS: 325.0000 mg | ORAL_TABLET | Freq: Four times a day (QID) | ORAL | Status: DC | PRN

## 2019-08-03 MED ORDER — CEFAZOLIN SODIUM-DEXTROSE 2-4 GM/100ML-% IV SOLN
2.0000 g | Freq: Four times a day (QID) | INTRAVENOUS | Status: AC
  Administered 2019-08-04: 03:00:00 2 g via INTRAVENOUS
  Filled 2019-08-03 (×3): qty 100

## 2019-08-03 MED ORDER — METHOCARBAMOL 500 MG PO TABS
500.0000 mg | ORAL_TABLET | Freq: Four times a day (QID) | ORAL | Status: DC | PRN
  Administered 2019-08-04: 500 mg via ORAL
  Filled 2019-08-03: qty 1

## 2019-08-03 MED ORDER — SODIUM CHLORIDE (PF) 0.9 % IJ SOLN
INTRAMUSCULAR | Status: AC
  Filled 2019-08-03: qty 50

## 2019-08-03 MED ORDER — OXYCODONE HCL 5 MG PO TABS: 5.0000 mg | ORAL_TABLET | Freq: Once | ORAL | Status: DC | PRN

## 2019-08-03 MED ORDER — FENTANYL CITRATE (PF) 100 MCG/2ML IJ SOLN: 25.0000 ug | INTRAMUSCULAR | Status: DC | PRN

## 2019-08-03 MED ORDER — SODIUM CHLORIDE 0.9 % IV SOLN: INTRAVENOUS | Status: DC | PRN

## 2019-08-03 MED ORDER — BUPIVACAINE LIPOSOME 1.3 % IJ SUSP
INTRAMUSCULAR | Status: AC
  Filled 2019-08-03: qty 20

## 2019-08-03 MED ORDER — METOCLOPRAMIDE HCL 10 MG PO TABS: 5.0000 mg | ORAL_TABLET | Freq: Three times a day (TID) | ORAL | Status: DC | PRN

## 2019-08-03 MED ORDER — MORPHINE SULFATE (PF) 10 MG/ML IV SOLN
INTRAVENOUS | Status: AC
  Filled 2019-08-03: qty 1

## 2019-08-03 MED ORDER — HYDROXYZINE HCL 50 MG PO TABS
50.0000 mg | ORAL_TABLET | Freq: Three times a day (TID) | ORAL | Status: DC | PRN
  Filled 2019-08-03: qty 1

## 2019-08-03 MED ORDER — VASOPRESSIN 20 UNIT/ML IV SOLN
INTRAVENOUS | Status: DC | PRN
  Administered 2019-08-03: 1 [IU] via INTRAVENOUS

## 2019-08-03 MED ORDER — GLYCOPYRROLATE 0.2 MG/ML IJ SOLN
INTRAMUSCULAR | Status: DC | PRN
  Administered 2019-08-03: .2 mg via INTRAVENOUS

## 2019-08-03 MED ORDER — SODIUM CHLORIDE 0.9 % IV SOLN: INTRAVENOUS | Status: DC

## 2019-08-03 MED ORDER — CEFAZOLIN SODIUM-DEXTROSE 2-4 GM/100ML-% IV SOLN
2.0000 g | INTRAVENOUS | Status: AC
  Administered 2019-08-03: 2 g via INTRAVENOUS

## 2019-08-03 MED ORDER — PROPOFOL 500 MG/50ML IV EMUL
INTRAVENOUS | Status: AC
  Filled 2019-08-03: qty 50

## 2019-08-03 MED ORDER — MAGNESIUM HYDROXIDE 400 MG/5ML PO SUSP
30.0000 mL | Freq: Every day | ORAL | Status: DC | PRN
  Administered 2019-08-05: 30 mL via ORAL
  Filled 2019-08-03: qty 30

## 2019-08-03 MED ORDER — OXYCODONE HCL 5 MG PO TABS
10.0000 mg | ORAL_TABLET | ORAL | Status: DC | PRN
  Administered 2019-08-03 – 2019-08-04 (×3): 10 mg via ORAL
  Administered 2019-08-04: 09:00:00 15 mg via ORAL
  Administered 2019-08-05: 06:00:00 10 mg via ORAL
  Filled 2019-08-03 (×2): qty 2
  Filled 2019-08-03: qty 3
  Filled 2019-08-03 (×2): qty 2

## 2019-08-03 MED ORDER — COLCHICINE 0.6 MG PO TABS: 0.6000 mg | ORAL_TABLET | Freq: Every day | ORAL | Status: DC | PRN

## 2019-08-03 MED ORDER — HYDROMORPHONE HCL 1 MG/ML IJ SOLN
0.5000 mg | INTRAMUSCULAR | Status: DC | PRN
  Administered 2019-08-03: 17:00:00 0.5 mg via INTRAVENOUS
  Administered 2019-08-03: 21:00:00 1 mg via INTRAVENOUS
  Filled 2019-08-03 (×2): qty 1

## 2019-08-03 MED ORDER — FENTANYL CITRATE (PF) 100 MCG/2ML IJ SOLN
INTRAMUSCULAR | Status: AC
  Filled 2019-08-03: qty 2

## 2019-08-03 MED ORDER — METOCLOPRAMIDE HCL 5 MG/ML IJ SOLN: 5.0000 mg | Freq: Three times a day (TID) | INTRAMUSCULAR | Status: DC | PRN

## 2019-08-03 MED ORDER — DOCUSATE SODIUM 100 MG PO CAPS
100.0000 mg | ORAL_CAPSULE | Freq: Two times a day (BID) | ORAL | Status: DC
  Administered 2019-08-03 – 2019-08-05 (×4): 100 mg via ORAL
  Filled 2019-08-03 (×4): qty 1

## 2019-08-03 MED ORDER — LISINOPRIL 5 MG PO TABS
5.0000 mg | ORAL_TABLET | Freq: Every day | ORAL | Status: DC
  Administered 2019-08-04 – 2019-08-05 (×2): 5 mg via ORAL
  Filled 2019-08-03 (×2): qty 1

## 2019-08-03 MED ORDER — SEVOFLURANE IN SOLN
RESPIRATORY_TRACT | Status: AC
  Filled 2019-08-03: qty 250

## 2019-08-03 MED ORDER — MAGNESIUM CITRATE PO SOLN
1.0000 | Freq: Once | ORAL | Status: DC | PRN
  Filled 2019-08-03: qty 296

## 2019-08-03 MED ORDER — ALUM & MAG HYDROXIDE-SIMETH 200-200-20 MG/5ML PO SUSP: 30.0000 mL | ORAL | Status: DC | PRN

## 2019-08-03 MED ORDER — LEVOTHYROXINE SODIUM 50 MCG PO TABS
150.0000 ug | ORAL_TABLET | Freq: Every day | ORAL | Status: DC
  Administered 2019-08-04 – 2019-08-05 (×2): 150 ug via ORAL
  Filled 2019-08-03 (×2): qty 1

## 2019-08-03 MED ORDER — LACTATED RINGERS IV SOLN: INTRAVENOUS | Status: DC

## 2019-08-03 MED ORDER — TRAMADOL HCL 50 MG PO TABS
50.0000 mg | ORAL_TABLET | Freq: Four times a day (QID) | ORAL | Status: DC
  Administered 2019-08-03 – 2019-08-05 (×7): 50 mg via ORAL
  Filled 2019-08-03 (×7): qty 1

## 2019-08-03 MED ORDER — PHENOL 1.4 % MT LIQD
1.0000 | OROMUCOSAL | Status: DC | PRN
  Filled 2019-08-03: qty 177

## 2019-08-03 MED ORDER — MIDAZOLAM HCL 2 MG/2ML IJ SOLN
INTRAMUSCULAR | Status: AC
  Filled 2019-08-03: qty 2

## 2019-08-03 MED ORDER — FENTANYL CITRATE (PF) 100 MCG/2ML IJ SOLN
INTRAMUSCULAR | Status: DC | PRN
  Administered 2019-08-03: 25 ug via INTRAVENOUS

## 2019-08-03 MED ORDER — FAMOTIDINE 20 MG PO TABS
ORAL_TABLET | ORAL | Status: AC
  Administered 2019-08-03: 07:00:00 20 mg via ORAL
  Filled 2019-08-03: qty 1

## 2019-08-03 MED ORDER — PANTOPRAZOLE SODIUM 40 MG PO TBEC
40.0000 mg | DELAYED_RELEASE_TABLET | Freq: Every day | ORAL | Status: DC
  Administered 2019-08-04 – 2019-08-05 (×2): 40 mg via ORAL
  Filled 2019-08-03 (×2): qty 1

## 2019-08-03 MED ORDER — ACETAMINOPHEN 500 MG PO TABS
1000.0000 mg | ORAL_TABLET | Freq: Four times a day (QID) | ORAL | Status: AC
  Administered 2019-08-03 – 2019-08-04 (×3): 1000 mg via ORAL
  Filled 2019-08-03 (×4): qty 2

## 2019-08-03 MED ORDER — ONDANSETRON HCL 4 MG/2ML IJ SOLN
INTRAMUSCULAR | Status: DC | PRN
Start: 2019-08-03 — End: 2019-08-03
  Administered 2019-08-03: 4 mg via INTRAVENOUS

## 2019-08-03 MED ORDER — METHOCARBAMOL 1000 MG/10ML IJ SOLN
500.0000 mg | Freq: Four times a day (QID) | INTRAVENOUS | Status: DC | PRN
  Filled 2019-08-03: qty 5

## 2019-08-03 MED ORDER — ONDANSETRON HCL 4 MG PO TABS: 4.0000 mg | ORAL_TABLET | Freq: Four times a day (QID) | ORAL | Status: DC | PRN

## 2019-08-03 MED ORDER — GLYCOPYRROLATE 0.2 MG/ML IJ SOLN
INTRAMUSCULAR | Status: AC
  Filled 2019-08-03: qty 1

## 2019-08-03 MED ORDER — MORPHINE SULFATE 10 MG/ML IJ SOLN
INTRAMUSCULAR | Status: DC | PRN
  Administered 2019-08-03: 10 mg via INTRAMUSCULAR

## 2019-08-03 MED ORDER — LIDOCAINE HCL (CARDIAC) PF 100 MG/5ML IV SOSY
PREFILLED_SYRINGE | INTRAVENOUS | Status: DC | PRN
  Administered 2019-08-03: 100 mg via INTRAVENOUS

## 2019-08-03 MED ORDER — CITALOPRAM HYDROBROMIDE 20 MG PO TABS
40.0000 mg | ORAL_TABLET | Freq: Every day | ORAL | Status: DC
  Administered 2019-08-04 – 2019-08-05 (×2): 40 mg via ORAL
  Filled 2019-08-03 (×2): qty 2

## 2019-08-03 MED ORDER — DIPHENHYDRAMINE HCL 12.5 MG/5ML PO ELIX
12.5000 mg | ORAL_SOLUTION | ORAL | Status: DC | PRN
  Administered 2019-08-03: 17:00:00 12.5 mg via ORAL
  Administered 2019-08-04 (×2): 25 mg via ORAL
  Administered 2019-08-04: 01:00:00 12.5 mg via ORAL
  Filled 2019-08-03: qty 5
  Filled 2019-08-03 (×2): qty 10
  Filled 2019-08-03: qty 5
  Filled 2019-08-03: qty 10

## 2019-08-03 MED ORDER — CHLORHEXIDINE GLUCONATE 4 % EX LIQD: 60.0000 mL | Freq: Once | CUTANEOUS | Status: DC

## 2019-08-03 MED ORDER — CEFAZOLIN SODIUM-DEXTROSE 2-4 GM/100ML-% IV SOLN
INTRAVENOUS | Status: AC
  Administered 2019-08-03: 2000 mg
  Filled 2019-08-03: qty 100

## 2019-08-03 MED ORDER — ONDANSETRON HCL 4 MG/2ML IJ SOLN
4.0000 mg | Freq: Four times a day (QID) | INTRAMUSCULAR | Status: DC | PRN
  Administered 2019-08-03: 16:00:00 4 mg via INTRAVENOUS
  Filled 2019-08-03: qty 2

## 2019-08-03 MED ORDER — DEXMEDETOMIDINE HCL IN NACL 200 MCG/50ML IV SOLN
INTRAVENOUS | Status: AC
  Filled 2019-08-03: qty 50

## 2019-08-03 MED ORDER — MIDAZOLAM HCL 5 MG/5ML IJ SOLN
INTRAMUSCULAR | Status: DC | PRN
  Administered 2019-08-03: 2 mg via INTRAVENOUS

## 2019-08-03 MED ORDER — PROPOFOL 500 MG/50ML IV EMUL
INTRAVENOUS | Status: DC | PRN
  Administered 2019-08-03: 75 ug/kg/min via INTRAVENOUS

## 2019-08-03 MED ORDER — BUPIVACAINE-EPINEPHRINE (PF) 0.25% -1:200000 IJ SOLN
INTRAMUSCULAR | Status: AC
  Filled 2019-08-03: qty 30

## 2019-08-03 MED ORDER — FAMOTIDINE 20 MG PO TABS: 20.0000 mg | ORAL_TABLET | Freq: Once | ORAL | Status: AC

## 2019-08-03 SURGICAL SUPPLY — 74 items
BLADE SAGITTAL 25.0X1.19X90 (BLADE) ×2 IMPLANT
BLOCK CUTTING FEMUR 2 RT MED (MISCELLANEOUS) ×1 IMPLANT
BLOCK CUTTING TIBIAL 2 RT (MISCELLANEOUS) ×1 IMPLANT
BLOCK CUTTING TIBIAL 4 RT MIS (MISCELLANEOUS) ×1 IMPLANT
BNDG ELASTIC 6X5.8 VLCR STR LF (GAUZE/BANDAGES/DRESSINGS) ×2 IMPLANT
CANISTER SUCT 1200ML W/VALVE (MISCELLANEOUS) ×2 IMPLANT
CANISTER SUCT 3000ML PPV (MISCELLANEOUS) ×4 IMPLANT
CANISTER WOUND CARE 500ML ATS (WOUND CARE) ×2 IMPLANT
CEMENT HV SMART SET (Cement) ×4 IMPLANT
CHLORAPREP W/TINT 26 (MISCELLANEOUS) ×4 IMPLANT
COOLER POLAR GLACIER W/PUMP (MISCELLANEOUS) ×2 IMPLANT
COVER WAND RF STERILE (DRAPES) ×2 IMPLANT
CUFF TOURN SGL QUICK 24 (TOURNIQUET CUFF)
CUFF TOURN SGL QUICK 30 (TOURNIQUET CUFF)
CUFF TOURN SGL QUICK 34 (TOURNIQUET CUFF) ×1
CUFF TRNQT CYL 24X4X16.5-23 (TOURNIQUET CUFF) IMPLANT
CUFF TRNQT CYL 30X4X21-28X (TOURNIQUET CUFF) IMPLANT
CUFF TRNQT CYL 34X4.125X (TOURNIQUET CUFF) IMPLANT
DRAPE 3/4 80X56 (DRAPES) ×4 IMPLANT
ELECT CAUTERY BLADE 6.4 (BLADE) ×2 IMPLANT
ELECT REM PT RETURN 9FT ADLT (ELECTROSURGICAL) ×2
ELECTRODE REM PT RTRN 9FT ADLT (ELECTROSURGICAL) ×1 IMPLANT
FEMORAL COMPONENT RIGHT SZ2P (Femur) ×1 IMPLANT
FEMUR BONE MODEL (MISCELLANEOUS) ×1 IMPLANT
GAUZE SPONGE 4X4 12PLY STRL (GAUZE/BANDAGES/DRESSINGS) ×2 IMPLANT
GAUZE XEROFORM 1X8 LF (GAUZE/BANDAGES/DRESSINGS) ×2 IMPLANT
GLOVE BIOGEL PI IND STRL 9 (GLOVE) ×1 IMPLANT
GLOVE BIOGEL PI INDICATOR 9 (GLOVE) ×1
GLOVE INDICATOR 8.0 STRL GRN (GLOVE) ×2 IMPLANT
GLOVE SURG ORTHO 8.0 STRL STRW (GLOVE) ×2 IMPLANT
GLOVE SURG SYN 9.0  PF PI (GLOVE) ×1
GLOVE SURG SYN 9.0 PF PI (GLOVE) ×1 IMPLANT
GOWN SRG 2XL LVL 4 RGLN SLV (GOWNS) ×1 IMPLANT
GOWN STRL NON-REIN 2XL LVL4 (GOWNS) ×1
GOWN STRL REUS W/ TWL LRG LVL3 (GOWN DISPOSABLE) ×1 IMPLANT
GOWN STRL REUS W/ TWL XL LVL3 (GOWN DISPOSABLE) ×1 IMPLANT
GOWN STRL REUS W/TWL LRG LVL3 (GOWN DISPOSABLE) ×1
GOWN STRL REUS W/TWL XL LVL3 (GOWN DISPOSABLE) ×1
HOLDER FOLEY CATH W/STRAP (MISCELLANEOUS) ×2 IMPLANT
HOOD PEEL AWAY FLYTE STAYCOOL (MISCELLANEOUS) ×4 IMPLANT
INSERT TIBIAL FLEX SZ2 RT H17 (Insert) ×1 IMPLANT
KIT PREVENA INCISION MGT20CM45 (CANNISTER) ×2 IMPLANT
KIT TURNOVER KIT A (KITS) ×2 IMPLANT
NDL SAFETY ECLIPSE 18X1.5 (NEEDLE) ×1 IMPLANT
NDL SPNL 18GX3.5 QUINCKE PK (NEEDLE) ×1 IMPLANT
NDL SPNL 20GX3.5 QUINCKE YW (NEEDLE) ×1 IMPLANT
NEEDLE HYPO 18GX1.5 SHARP (NEEDLE) ×1
NEEDLE SPNL 18GX3.5 QUINCKE PK (NEEDLE) ×2 IMPLANT
NEEDLE SPNL 20GX3.5 QUINCKE YW (NEEDLE) ×2 IMPLANT
NS IRRIG 1000ML POUR BTL (IV SOLUTION) ×2 IMPLANT
PACK TOTAL KNEE (MISCELLANEOUS) ×2 IMPLANT
PAD WRAPON POLAR KNEE (MISCELLANEOUS) ×1 IMPLANT
PATELLA RESURFACING MEDACTA 02 (Bone Implant) ×1 IMPLANT
PENCIL SMOKE EVACUATOR COATED (MISCELLANEOUS) ×2 IMPLANT
PULSAVAC PLUS IRRIG FAN TIP (DISPOSABLE) ×2
SCALPEL PROTECTED #10 DISP (BLADE) ×4 IMPLANT
SOL .9 NS 3000ML IRR  AL (IV SOLUTION) ×1
SOL .9 NS 3000ML IRR UROMATIC (IV SOLUTION) ×1 IMPLANT
STAPLER SKIN PROX 35W (STAPLE) ×2 IMPLANT
STEM EXTENSION 11MMX30MM (Stem) ×1 IMPLANT
SUCTION FRAZIER HANDLE 10FR (MISCELLANEOUS) ×1
SUCTION TUBE FRAZIER 10FR DISP (MISCELLANEOUS) ×1 IMPLANT
SUT DVC 2 QUILL PDO  T11 36X36 (SUTURE) ×1
SUT DVC 2 QUILL PDO T11 36X36 (SUTURE) ×1 IMPLANT
SUT ETHIBOND 2 V 37 (SUTURE) IMPLANT
SUT V-LOC 90 ABS DVC 3-0 CL (SUTURE) ×2 IMPLANT
SYR 20ML LL LF (SYRINGE) ×2 IMPLANT
SYR 50ML LL SCALE MARK (SYRINGE) ×4 IMPLANT
TIB TRAY SZ 2 R FIXED (Joint) ×1 IMPLANT
TIP FAN IRRIG PULSAVAC PLUS (DISPOSABLE) ×1 IMPLANT
TOWEL OR 17X26 4PK STRL BLUE (TOWEL DISPOSABLE) ×2 IMPLANT
TOWER CARTRIDGE SMART MIX (DISPOSABLE) ×2 IMPLANT
TRAY FOLEY MTR SLVR 16FR STAT (SET/KITS/TRAYS/PACK) ×2 IMPLANT
WRAPON POLAR PAD KNEE (MISCELLANEOUS) ×2

## 2019-08-03 NOTE — Anesthesia Preprocedure Evaluation (Addendum)
Anesthesia Evaluation  Patient identified by MRN, date of birth, ID band Patient awake    Reviewed: Allergy & Precautions, H&P , NPO status , Patient's Chart, lab work & pertinent test results  Airway Mallampati: II  TM Distance: >3 FB Neck ROM: full    Dental  (+) Teeth Intact   Pulmonary neg pulmonary ROS,           Cardiovascular hypertension,      Neuro/Psych PSYCHIATRIC DISORDERS Anxiety Depression negative neurological ROS     GI/Hepatic negative GI ROS, Neg liver ROS,   Endo/Other  Hypothyroidism Morbid obesity  Renal/GU      Musculoskeletal   Abdominal   Peds  Hematology negative hematology ROS (+)   Anesthesia Other Findings Past Medical History: No date: Anxiety No date: Arthritis No date: Chronic kidney disease     Comment:  early stages. creatnine recently was normal No date: Depression No date: Hypertension No date: Hypothyroidism  Past Surgical History: No date: ABDOMINAL HYSTERECTOMY No date: CHOLECYSTECTOMY  BMI    Body Mass Index: 44.27 kg/m      Reproductive/Obstetrics negative OB ROS                            Anesthesia Physical Anesthesia Plan  ASA: II  Anesthesia Plan: Spinal   Post-op Pain Management:    Induction:   PONV Risk Score and Plan:   Airway Management Planned: Natural Airway and Nasal Cannula  Additional Equipment:   Intra-op Plan:   Post-operative Plan:   Informed Consent: I have reviewed the patients History and Physical, chart, labs and discussed the procedure including the risks, benefits and alternatives for the proposed anesthesia with the patient or authorized representative who has indicated his/her understanding and acceptance.     Dental Advisory Given  Plan Discussed with: Anesthesiologist  Anesthesia Plan Comments:         Anesthesia Quick Evaluation

## 2019-08-03 NOTE — Transfer of Care (Signed)
Immediate Anesthesia Transfer of Care Note  Patient: Sara Hebert  Procedure(s) Performed: RIGHT TOTAL KNEE ARTHROPLASTY (Right Knee)  Patient Location: PACU  Anesthesia Type:General  Level of Consciousness: awake  Airway & Oxygen Therapy: Patient Spontanous Breathing and Patient connected to face mask oxygen  Post-op Assessment: Report given to RN and Post -op Vital signs reviewed and stable  Post vital signs: Reviewed  Last Vitals:  Vitals Value Taken Time  BP 105/57 08/03/19 0934  Temp 35.9 C 08/03/19 0930  Pulse 79 08/03/19 0936  Resp 13 08/03/19 0936  SpO2 100 % 08/03/19 0936  Vitals shown include unvalidated device data.  Last Pain:  Vitals:   08/03/19 0642  TempSrc: Temporal  PainSc: 3       Patients Stated Pain Goal: 0 (08/03/19 2909)  Complications: No apparent anesthesia complications

## 2019-08-03 NOTE — Op Note (Signed)
08/03/2019  9:31 AM  PATIENT:  Sara Hebert  59 y.o. female  PRE-OPERATIVE DIAGNOSIS:  Primary osteoarthritis of right knee  POST-OPERATIVE DIAGNOSIS:  Primary osteoarthritis of right knee  PROCEDURE:  Procedure(s): RIGHT TOTAL KNEE ARTHROPLASTY (Right)  SURGEON: Leitha Schuller, MD  ASSISTANTS: Cranston Neighbor, PA-C  ANESTHESIA:   spinal  EBL:  Total I/O In: 1000 [I.V.:1000] Out: 650 [Urine:450; Blood:200]  BLOOD ADMINISTERED:none  DRAINS: Incisional wound VAC   LOCAL MEDICATIONS USED:  MARCAINE    and OTHER Exparel and morphine  SPECIMEN:  No Specimen  DISPOSITION OF SPECIMEN:  N/A  COUNTS:  YES  TOURNIQUET:   Total Tourniquet Time Documented: Thigh (Right) - 20 minutes Thigh (Right) - 25 minutes Total: Thigh (Right) - 45 minutes   IMPLANTS: Medacta GMK Sphere right 2+ femur, right 2 tibia, 2 patella, short stem on tibial component with all components cemented.  DICTATION: Reubin Milan Dictation  patient was brought to the operating room and spinal anesthesia was obtained. After prepping and draping therightleg in sterile fashion, andafter patient identification and timeout procedures were completed,tourniquet was raised andmidline skin incision was made followed by medial parapatellar arthrotomywith bone-on-bone medial compartment osteoarthritis advanced patellofemoral arthritis andmoderatelateral compartment arthritis,partial synovectomy was also carried out. ThePCL and fat pad were excisedwith the ACL being deficient and the medial meniscus also having previously been removed except for the posterior horn,off the anterior into the meniscus in the proximal tibia cutting guide from the [facility] was appliedand theproximal tibia cut carried out. The distal femoral cut was carried out in a similar fashion there is asignificantflexion contracture and varus deformity and after the initial cuts there was an adequate space for the space gauge and so  the proximal tibia and distal femur were all were both recut. The2+femoralcutting guide applied with anterior posterior and chamfer cuts made. Large posterior osteophytes were removed at this time with the aid of a angled osteotome and curette the posterior horns of the menisci were removed at this point.Injection of the above medication was carried out after the femoral and tibial cuts were carried out. The2baseplate trial was placed pinned into position and proximal tibial preparation carried out with drilling hand reaming and the keel punch followed by placement of the2+femur and sizing the tibial insert size17 millimetergave the best fit with stability and full extension. The distal femoral drill holes were made in the notch cut for the trochlear groove was then carried out with trials were then removed the patella was cut using the patellar cutting guide and it sized to a size2afterdrill holes have been made The knee was irrigated with pulsatile lavage and the bony surfaces dried the tibial component was cemented into place first. Excess cement was removed and the polyethylene insert placed with a torque screw placed with a torque screwdriver tightened. The distal femoral component was placed and the knee was held in extension as the patellar button was clamped into place. After the cement was set,excess cement was removed and the knee was again irrigated thoroughly thoroughly irrigated. The tourniquet was let down and hemostasis checkedwithelectrocautery.The arthrotomy was repaired witha heavy Quill suture, followed by 3-0 V lock subcuticular closure,skin staplesfollowed byincisional wound VAC and Polar Care.Marland Kitchen  PLAN OF CARE: Admit to inpatient   PATIENT DISPOSITION:  PACU - hemodynamically stable.

## 2019-08-03 NOTE — Evaluation (Signed)
Physical Therapy Evaluation Patient Details Name: Sara Hebert MRN: [id number] DOB: [birth year] Today's Date: 08/03/2019   History of Present Illness  59 y/o female s/p R TKA 08/03/19.  She has had increasing pain for 7 years and finally needed to have replacement.  Hypothyroidism  Clinical Impression  Pt tearful 2/2 on arrival, despite this she was eager to work with PT and showed great effort t/o the eval and subsequent exercises and mobility/gait training.  Pt clearly having pain with most acts, but was able to remain on tasks with extra time and cuing.  Overall she showed metrics expected with typical home bound post-op day 0 though again she did need some extra time and cuing. Pt had 95 degrees of flexion, was able to do SLRs with minimal warm up, did not need direct assist with mobility and was able to ambulate ~20 ft with slow, labored but safe gait.      Follow Up Recommendations Home health PT    Equipment Recommendations  Rolling walker with 5" wheels;3in1 (PT)    Recommendations for Other Services       Precautions / Restrictions Precautions Precautions: Fall;Knee Restrictions Weight Bearing Restrictions: Yes RLE Weight Bearing: Weight bearing as tolerated      Mobility  Bed Mobility Overal bed mobility: Modified Independent             General bed mobility comments: Pt able to get LEs slowly, but w/o assist, to EOB and get to sitting position w/o direct assist  Transfers Overall transfer level: Needs assistance Equipment used: Rolling walker (2 wheeled) Transfers: Sit to/from Stand Sit to Stand: Min assist;Min guard         General transfer comment: Pt with heavy UE use and extra effort to get to standing, but needed only very light assist to keep weight forward and fully attain standing.  Reliant on walker in standing  Ambulation/Gait Ambulation/Gait assistance: Min guard Gait Distance (Feet): 20 Feet Assistive device: Rolling walker (2  wheeled)       General Gait Details: Pt with slow, but steady step-through gait.  She was highly reliant on the walker but did not have any buckling on R knee (though clearly minimizing stance time) Pt's O2 in the high 90s post ambulation, HR also in the 90s.  Stairs            Wheelchair Mobility    Modified Rankin (Stroke Patients Only)       Balance Overall balance assessment: Needs assistance   Sitting balance-Leahy Scale: Good Sitting balance - Comments: Pt with some nausea, but no unsteadiness in sitting   Standing balance support: Bilateral upper extremity supported Standing balance-Leahy Scale: Fair Standing balance comment: highly reliant on walker, hesitant with full R WBing, but no LOBs or overt unsteadiness                             Pertinent Vitals/Pain Pain Assessment: 0-10 Pain Score: 10-Worst pain ever Pain Location: op knee, pt tearful on arrival    Home Living Family/patient expects to be discharged to:: Private residence Living Arrangements: Other relatives;Alone Available Help at Discharge: Family;Available 24 hours/day Type of Home: House Home Access: Stairs to enter Entrance Stairs-Rails: Left Entrance Stairs-Number of Steps: 3(at sister's home, where she will be d/c'ing) Home Layout: Able to live on main level with bedroom/bathroom        Prior Function Level of Independence: Independent with assistive device(s)  Comments: Pt has been using a single crutch recently, able to be independent otherwise     Hand Dominance        Extremity/Trunk Assessment   Upper Extremity Assessment Upper Extremity Assessment: Overall WFL for tasks assessed    Lower Extremity Assessment Lower Extremity Assessment: Overall WFL for tasks assessed(expected post-op weakness/pain limitations in R knee)       Communication   Communication: No difficulties  Cognition Arousal/Alertness: Awake/alert;Lethargic Behavior During  Therapy: WFL for tasks assessed/performed Overall Cognitive Status: Within Functional Limits for tasks assessed                                 General Comments: Pt was minimally sleepy from sx, but appropriate t/o      General Comments      Exercises Total Joint Exercises Quad Sets: Strengthening;10 reps Short Arc Quad: Strengthening;AROM;10 reps Heel Slides: AROM;Strengthening;5 reps Hip ABduction/ADduction: Strengthening;10 reps Straight Leg Raises: AROM;5 reps Knee Flexion: PROM;5 reps;AROM Goniometric ROM: 2-95 (AROM to 80 on first rep)   Assessment/Plan    PT Assessment Patient needs continued PT services  PT Problem List Decreased strength;Decreased range of motion;Decreased activity tolerance;Decreased balance;Decreased mobility;Decreased coordination;Decreased knowledge of use of DME;Decreased safety awareness;Decreased knowledge of precautions;Pain       PT Treatment Interventions DME instruction;Gait training;Stair training;Functional mobility training;Therapeutic activities;Therapeutic exercise;Balance training;Neuromuscular re-education;Patient/family education    PT Goals (Current goals can be found in the Care Plan section)  Acute Rehab PT Goals Patient Stated Goal: go home PT Goal Formulation: With patient Time For Goal Achievement: 08/17/19 Potential to Achieve Goals: Good    Frequency BID   Barriers to discharge        Co-evaluation               AM-PAC PT "6 Clicks" Mobility  Outcome Measure Help needed turning from your back to your side while in a flat bed without using bedrails?: A Little Help needed moving from lying on your back to sitting on the side of a flat bed without using bedrails?: A Little Help needed moving to and from a bed to a chair (including a wheelchair)?: A Little Help needed standing up from a chair using your arms (e.g., wheelchair or bedside chair)?: A Little Help needed to walk in hospital room?: A  Lot Help needed climbing 3-5 steps with a railing? : A Lot 6 Click Score: 16    End of Session Equipment Utilized During Treatment: Gait belt Activity Tolerance: Patient limited by pain Patient left: with chair alarm set;with call bell/phone within reach;with family/visitor present Nurse Communication: Mobility status;Patient requests pain meds PT Visit Diagnosis: Muscle weakness (generalized) (M62.81);Difficulty in walking, not elsewhere classified (R26.2);Pain Pain - Right/Left: Right Pain - part of body: Knee    Time: 8315-1761 PT Time Calculation (min) (ACUTE ONLY): 59 min   Charges:   PT Evaluation $PT Eval Low Complexity: 1 Low PT Treatments $Gait Training: 8-22 mins $Therapeutic Exercise: 8-22 mins        Kreg Shropshire, DPT 08/03/2019, 5:19 PM

## 2019-08-03 NOTE — H&P (Signed)
History of the Present Illness: Sara Hebert is a 59 y.o. female here for history and physical for right total knee arthroplasty with Dr. Hessie Knows on 08/03/2019. Patient's pain is been present for nearly 7 years. Pain is been increasingly getting worse and limiting her activities day living. Over the last 3 months she has been ambulating with a crutch due to the right knee buckling and giving way. Pain is moderate to severe. She has swelling and instability throughout the right knee. She has had no improvement with injection therapy. Unable to walk 25 feet without assistance. X-rays from [facility] does show severe degenerative changes of the right knee joint with complete loss of joint space. Patient is seen Dr. Truitt Leep, discussed risk benefits and complications of surgery, patient has agreed and consented to the procedure with Dr. Hessie Knows.  I have reviewed past medical, surgical, social and family history, and allergies as documented in the EMR.  Past Medical History: Past Medical History:  Diagnosis Date  . Allergy  pt states she is unsure why on chart  . Chronic kidney disease  . Depression  . Hypertension  . Thyroid disease   Past Surgical History: Past Surgical History:  Procedure Laterality Date  . CHOLECYSTECTOMY 2018  . HYSTERECTOMY 1994   Past Family History: Family History  Problem Relation Age of Onset  . Kidney disease Mother  dialysis 92 yrs  . High blood pressure (Hypertension) Mother  . Breast cancer Mother  . High blood pressure (Hypertension) Father  . Other Father  enlarged heart  . High blood pressure (Hypertension) Sister   Medications: Current Outpatient Medications Ordered in Epic  Medication Sig Dispense Refill  . acetaminophen/diphenhydramine (TYLENOL PM ORAL) Take by mouth nightly  . buPROPion (WELLBUTRIN XL) 300 MG XL tablet Take 300 mg by mouth once daily.  . citalopram (CELEXA) 40 MG tablet Take 40 mg by mouth once daily  . colchicine  (COLCRYS) 0.6 mg tablet Take 1 tablet (0.6 mg total) by mouth 2 (two) times daily Take 1 tablet twice a day for 1 week and then 1 tablet once a day 30 tablet 1  . hydrOXYzine (ATARAX) 50 MG tablet Take 50 mg by mouth 3 (three) times daily as needed  . levothyroxine (SYNTHROID) 150 MCG tablet Take by mouth  . lisinopriL (ZESTRIL) 5 MG tablet Take 5 mg by mouth once daily  . omega-3 fatty acids-fish oil 300-1,000 mg capsule Take by mouth   No current Epic-ordered facility-administered medications on file.   Allergies: No Known Allergies   There is no height or weight on file to calculate BMI.  Review of Systems: A comprehensive 14 point ROS was performed, reviewed, and the pertinent orthopaedic findings are documented in the HPI.  There were no vitals filed for this visit.  General Physical Examination:  General:  Well developed, well nourished, no apparent distress, normal affect, antalgic gait with a crutch  HEENT: Head normocephalic, atraumatic, PERRL.   Abdomen: Soft, non tender, non distended, Bowel sounds present.  Heart: Examination of the heart reveals regular, rate, and rhythm. There is no murmur noted on ascultation. There is a normal apical pulse.  Lungs: Lungs are clear to auscultation. There is no wheeze, rhonchi, or crackles. There is normal expansion of bilateral chest walls.   Musculoskeletal Examination: On exam, the patient has full right knee extension, 2-3 degree contracture, and 115 degrees on flexion. Right knee marked crepitation and some posterior swelling. Right hip internal rotation to 30 degrees, external to  30 degrees.   Radiographs Right knee x-rays at [facility] on 04/13/2019: On the AP view, there is lateral subluxation of the tibia, complete loss of joint space, some bone erosion of the medial femoral condyle and varus deformity. On the lateral view, extensive posterior osteophytes, complete loss of joint space, anterior osteophytes at the  trochlea, and probable loose bodies as well.   X-ray Impression Severe arthritis.   CLINICAL DATA: Preop for total knee arthroplasty. My knee protocol.  EXAM: CT OF THE right KNEE WITHOUT CONTRAST  TECHNIQUE: Multidetector CT imaging of the technique right knee was performed according to the standard protocol. Multiplanar CT image reconstructions were also generated.  COMPARISON: Radiographs 04/13/2019  FINDINGS: The right hip is normally is located. There are mild to moderate right hip joint degenerative changes with joint space narrowing and spurring. No acute bony findings or evidence of AVN. The right hemipelvis is intact and the pubic symphysis and right SI joint are intact.  Advanced tricompartmental degenerative changes at the knee with joint space narrowing, osteophytic spurring, bony eburnation and subchondral cystic change. This is most severe in the medial compartment. No acute bony findings or osteochondral lesion. No joint effusion.  Axial views of the right ankle demonstrate minimal degenerative changes. No fracture or osteochondral lesion.  IMPRESSION: 1. Advanced tricompartmental degenerative changes at the knee. 2. Mild to moderate right hip joint degenerative changes. 3. No acute bony findings or osteochondral lesion.  Electronically Signed By: Rudie Meyer M.D. On: 06/28/2019 08:22  Assessment: ICD-10-CM  1. Primary osteoarthritis of right knee M17.11   Plan: 1. Risks, benefits, complications of a right total knee arthroplasty have been discussed with the patient. Patient has agreed and consented to the procedure with Dr. Kennedy Bucker on 08/03/2019.    Electronically signed by Patience Musca, PA at 07/27/2019 2:55 PM EDT  Reviewed paper H+P, will be scanned into chart. No changes noted.

## 2019-08-03 NOTE — Anesthesia Procedure Notes (Addendum)
Spinal  Patient location during procedure: OR Start time: 08/03/2019 7:34 AM Staffing Performed: resident/CRNA  Preanesthetic Checklist Completed: patient identified, IV checked, site marked, risks and benefits discussed, surgical consent, monitors and equipment checked, pre-op evaluation and timeout performed Spinal Block Patient position: sitting Prep: ChloraPrep Patient monitoring: continuous pulse ox, blood pressure and heart rate Approach: midline Location: L4-5 Injection technique: single-shot Needle Needle type: Quincke  Needle gauge: 22 G Additional Notes Negative paresthesia. Negative blood return. Positive free-flowing CSF. Expiration date of kit checked and confirmed. Patient tolerated procedure well, without complications.

## 2019-08-04 LAB — BASIC METABOLIC PANEL
Anion gap: 9 (ref 5–15)
BUN: 14 mg/dL (ref 6–20)
CO2: 27 mmol/L (ref 22–32)
Calcium: 8.5 mg/dL — ABNORMAL LOW (ref 8.9–10.3)
Chloride: 99 mmol/L (ref 98–111)
Creatinine, Ser: 1.16 mg/dL — ABNORMAL HIGH (ref 0.44–1.00)
GFR calc Af Amer: >60 mL/min (ref >60)
GFR calc non Af Amer: 53 mL/min — ABNORMAL LOW (ref >60)
Glucose, Bld: 131 mg/dL — ABNORMAL HIGH (ref 70–99)
Potassium: 4.2 mmol/L (ref 3.5–5.1)
Sodium: 135 mmol/L (ref 135–145)

## 2019-08-04 LAB — CBC
HCT: 34.2 % — ABNORMAL LOW (ref 36.0–46.0)
Hemoglobin: 11.2 g/dL — ABNORMAL LOW (ref 12.0–15.0)
MCH: 31.4 pg (ref 26.0–34.0)
MCHC: 32.7 g/dL (ref 30.0–36.0)
MCV: 95.8 fL (ref 80.0–100.0)
Platelets: 213 10*3/uL (ref 150–400)
RBC: 3.57 MIL/uL — ABNORMAL LOW (ref 3.87–5.11)
RDW: 12.7 % (ref 11.5–15.5)
WBC: 9.5 10*3/uL (ref 4.0–10.5)
nRBC: 0 % (ref 0.0–0.2)

## 2019-08-04 MED ORDER — MAGNESIUM HYDROXIDE 400 MG/5ML PO SUSP
30.0000 mL | Freq: Once | ORAL | Status: AC
  Administered 2019-08-04: 09:00:00 30 mL via ORAL
  Filled 2019-08-04: qty 30

## 2019-08-04 NOTE — Evaluation (Signed)
Occupational Therapy Evaluation Patient Details Name: Sara Hebert MRN: [id number] DOB: Jan 28, 1961 Today's Date: 08/04/2019    History of Present Illness 59 y/o female s/p R TKA 08/03/19.  She has had increasing pain for 7 years and finally needed to have replacement.  Hypothyroidism   Clinical Impression   Pt seen for OT evaluation this date, POD#1 from above surgery. Pt was independent in all ADL prior to surgery, however occasionally using a single crutch for mobility 2/2 R knee pain. Pt reports plan to stay with sister upon discharge initially during recovery. Pt is eager to return to PLOF with less pain and improved safety and independence. Pt currently requires minimal assist for LB dressing and bathing while in seated position due to pain and limited AROM of R knee. Pt reports itchy feet that will not stop itching. RN and MD aware, pt has taken medication to help, per RN. Pt very groggy/lethargic during session, difficulty maintaining alertness and keep eyes open. Pt reports this is due to recent pain medication and Benadryl. Pt/sister instructed in polar care mgt, falls prevention strategies, home/routines modifications, DME/AE for LB bathing and dressing tasks, pet care considerations, and compression stocking mgt. Handout provided. Pt would benefit from skilled OT services including additional instruction in dressing techniques with or without assistive devices for dressing and bathing skills to support recall and carryover prior to discharge and ultimately to maximize safety, independence, and minimize falls risk and caregiver burden. Do not currently anticipate any OT needs following this hospitalization.      Follow Up Recommendations  No OT follow up    Equipment Recommendations  3 in 1 bedside commode    Recommendations for Other Services       Precautions / Restrictions Precautions Precautions: Fall;Knee Precaution Booklet Issued: Yes (comment) Restrictions Weight  Bearing Restrictions: Yes RLE Weight Bearing: Weight bearing as tolerated      Mobility Bed Mobility Overal bed mobility: Modified Independent             General bed mobility comments: deferred, up in reclinerpt  Transfers Overall transfer level: Modified independent Equipment used: Rolling walker (2 wheeled) Transfers: Sit to/from Stand Sit to Stand: Min guard         General transfer comment: deferred, pt very sleepy, difficulty keeping eyes open and maintaining alertness    Balance Overall balance assessment: Modified Independent;Independent   Sitting balance-Leahy Scale: Good Sitting balance - Comments: Pt able to maintain sitting balance at EOB w/o assist, good confidence   Standing balance support: Bilateral upper extremity supported Standing balance-Leahy Scale: Fair Standing balance comment: continues to rely on walker, but not nearly as heavy as on eval, no LOBs or unsteadiness                           ADL either performed or assessed with clinical judgement   ADL Overall ADL's : Needs assistance/impaired                                       General ADL Comments: Pt requires Min A for LB ADL tasks, CGA for ADL transfers; sister in room and reports ability to provide needed level of care     Vision Baseline Vision/History: No visual deficits Patient Visual Report: No change from baseline       Perception     Praxis  Pertinent Vitals/Pain Pain Assessment: 0-10 Pain Score: 3  Pain Location: R knee Pain Descriptors / Indicators: Aching Pain Intervention(s): Limited activity within patient's tolerance;Monitored during session;Premedicated before session;Repositioned;Ice applied     Hand Dominance Right   Extremity/Trunk Assessment Upper Extremity Assessment Upper Extremity Assessment: Overall WFL for tasks assessed   Lower Extremity Assessment Lower Extremity Assessment: Overall WFL for tasks assessed;RLE  deficits/detail RLE Deficits / Details: expected post-op weakness/pain limitations in R knee   Cervical / Trunk Assessment Cervical / Trunk Assessment: Normal   Communication Communication Communication: No difficulties   Cognition Arousal/Alertness: Suspect due to medications Behavior During Therapy: WFL for tasks assessed/performed Overall Cognitive Status: Within Functional Limits for tasks assessed                                 General Comments: significantly sleepy, pt reporting recently taking pain medication and benadryl for itchy feet; appropriate, oriented, follows commands   General Comments       Exercises Exercises: Total Joint Total Joint Exercises Ankle Circles/Pumps: AROM;10 reps Quad Sets: Strengthening;15 reps Short Arc Quad: 10 reps;AROM Heel Slides: AROM;Strengthening;5 reps(resisted leg extensions) Hip ABduction/ADduction: Strengthening;10 reps Straight Leg Raises: AROM;Strengthening;10 reps Knee Flexion: PROM;5 reps Goniometric ROM: 1-93 Other Exercises Other Exercises: Pt/sister instructed in polar care mgt, compression stocking mgt, falls prevention, pet care considerations, AE/DME, and home/routines modifications to maximize safety andindep with ADL   Shoulder Instructions      Home Living Family/patient expects to be discharged to:: Private residence(will be staying at her sister's home) Living Arrangements: Alone(16 y/o granddaughter stays with her) Available Help at Discharge: Family;Available 24 hours/day(will be going home with sister at discharge) Type of Home: House Home Access: Stairs to enter [employer] of Steps: 3(at sister's home, where she will be d/c'ing) Entrance Stairs-Rails: Left Home Layout: Able to live on main level with bedroom/bathroom     Bathroom Shower/Tub: Occupational psychologist: Standard     Home Equipment: Shower seat - built in;Crutches          Prior  Functioning/Environment Level of Independence: Independent with assistive device(s)        Comments: Pt has been using a single crutch recently, able to be independent otherwise        OT Problem List: Decreased strength;Pain;Decreased range of motion;Decreased knowledge of use of DME or AE      OT Treatment/Interventions: Self-care/ADL training;Therapeutic exercise;Therapeutic activities;DME and/or AE instruction;Patient/family education;Balance training    OT Goals(Current goals can be found in the care plan section) Acute Rehab OT Goals Patient Stated Goal: go home OT Goal Formulation: With patient/family Time For Goal Achievement: 08/18/19 Potential to Achieve Goals: Good ADL Goals Pt Will Perform Lower Body Dressing: sit to/from stand;with adaptive equipment;with supervision Pt Will Transfer to Toilet: with supervision;ambulating(BSC over toilet, LRAD for amb) Additional ADL Goal #1: Pt will independently instruct family in compression stocking mgt Additional ADL Goal #2: Pt will independently instruct family in polar care mgt  OT Frequency: Min 1X/week   Barriers to D/C:            Co-evaluation              AM-PAC OT "6 Clicks" Daily Activity     Outcome Measure Help from another person eating meals?: None Help from another person taking care of personal grooming?: None Help from another person toileting, which includes using toliet, bedpan, or urinal?:  A Little Help from another person bathing (including washing, rinsing, drying)?: A Little Help from another person to put on and taking off regular upper body clothing?: None Help from another person to put on and taking off regular lower body clothing?: A Little 6 Click Score: 21   End of Session Nurse Communication: Other (comment)(feet itching)  Activity Tolerance: Patient limited by lethargy(medication suspected) Patient left: in chair;with call bell/phone within reach;with chair alarm set;Other  (comment)(polar care and compression stockings inplace)  OT Visit Diagnosis: Other abnormalities of gait and mobility (R26.89);Pain Pain - Right/Left: Right Pain - part of body: Knee                Time: 1036-1105 OT Time Calculation (min): 29 min Charges:  OT General Charges $OT Visit: 1 Visit OT Evaluation $OT Eval Moderate Complexity: 1 Mod OT Treatments $Self Care/Home Management : 8-22 mins  Richrd Prime, MPH, MS, OTR/L ascom [phone] 08/04/19, 11:29 AM

## 2019-08-04 NOTE — Anesthesia Postprocedure Evaluation (Signed)
Anesthesia Post Note  Patient: Sara Hebert  Procedure(s) Performed: RIGHT TOTAL KNEE ARTHROPLASTY (Right Knee)  Patient location during evaluation: Nursing Unit Anesthesia Type: Spinal Level of consciousness: oriented and awake and alert Pain management: pain level not controlled Vital Signs Assessment: post-procedure vital signs reviewed and stable Respiratory status: spontaneous breathing and respiratory function stable Cardiovascular status: blood pressure returned to baseline and stable Postop Assessment: no headache, no backache, no apparent nausea or vomiting and patient able to bend at knees Anesthetic complications: no     Last Vitals:  Vitals:   08/04/19 0518 08/04/19 0823  BP: (!) 149/86 (!) 143/90  Pulse: 97 95  Resp: 18   Temp:  36.9 C  SpO2: 96% 91%    Last Pain:  Vitals:   08/04/19 0823  TempSrc: Oral  PainSc: 10-Worst pain ever                 Rosanne Gutting

## 2019-08-04 NOTE — Progress Notes (Signed)
   Subjective: 1 Day Post-Op Procedure(s) (LRB): RIGHT TOTAL KNEE ARTHROPLASTY (Right) Patient reports pain as 9 on 0-10 scale.   Patient is well, and has had no acute complaints or problems Denies any CP, SOB, ABD pain. We will continue therapy today.  Plan is to go Home after hospital stay.  Objective: Vital signs in last 24 hours: Temp:  [96.7 F (35.9 C)-98.7 F (37.1 C)] 98.2 F (36.8 C) (04/02 0033) Pulse Rate:  [87-106] 97 (04/02 0518) Resp:  [8-20] 18 (04/02 0518) BP: (76-160)/(39-86) 149/86 (04/02 0518) SpO2:  [94 %-100 %] 96 % (04/02 0518)  Intake/Output from previous day: 04/01 0701 - 04/02 0700 In: 4496.3 [P.O.:480; I.V.:3916.3; IV Piggyback:100] Out: 4850 [Urine:4650; Blood:200] Intake/Output this shift: No intake/output data recorded.  Recent Labs    08/01/19 1055 08/03/19 1445 08/04/19 0435  HGB 13.2 12.2 11.2*   Recent Labs    08/03/19 1445 08/04/19 0435  WBC 10.4 9.5  RBC 3.91 3.57*  HCT 37.4 34.2*  PLT 228 213   Recent Labs    08/01/19 1055 08/01/19 1055 08/03/19 1445 08/04/19 0435  NA 136  --   --  135  K 4.1  --   --  4.2  CL 100  --   --  99  CO2 25  --   --  27  BUN 22*  --   --  14  CREATININE 1.07*   < > 1.09* 1.16*  GLUCOSE 110*  --   --  131*  CALCIUM 9.3  --   --  8.5*   < > = values in this interval not displayed.   No results for input(s): LABPT, INR in the last 72 hours.  EXAM General - Patient is Alert, Appropriate and Oriented Extremity - Neurovascular intact Sensation intact distally Intact pulses distally Dorsiflexion/Plantar flexion intact No cellulitis present Compartment soft Dressing - dressing C/D/I.  150 cc drainage of Praveena. Motor Function - intact, moving foot and toes well on exam.   Past Medical History:  Diagnosis Date  . Anxiety   . Arthritis   . Chronic kidney disease    early stages. creatnine recently was normal  . Depression   . Hypertension   . Hypothyroidism     Assessment/Plan:    1 Day Post-Op Procedure(s) (LRB): RIGHT TOTAL KNEE ARTHROPLASTY (Right) Active Problems:   Status post total knee replacement using cement, right  Estimated body mass index is 44.27 kg/m as calculated from the following:   Height as of this encounter: 5\' 5"  (1.651 m).   Weight as of this encounter: 120.7 kg. Advance diet Up with therapy  Needs bowel movement Labs are stable Vital signs are stable Patient making good progress of physical therapy.  Anticipate discharge home with home health PT tomorrow.  DVT Prophylaxis - Lovenox, Foot Pumps and TED hose Weight-Bearing as tolerated to right leg   T. , PA-C [facility] 08/04/2019, 8:20 AM

## 2019-08-04 NOTE — Progress Notes (Signed)
D: Pt alert and oriented x 4. Pt reports experiencing any pain this morning rated 10/10, PRN meds given and found helpful.   A: Scheduled medications administered to pt, per MD orders. Support and encouragement provided. Frequent verbal contact made.   R: No adverse drug reactions noted. Pt complaint with medications and treatment plan. Pt interacts well with staff on the unit. Pt is stable at this time, Will continue to monitor and provide care for as ordered.

## 2019-08-04 NOTE — TOC Initial Note (Signed)
Transition of Care [facility]) - Initial/Assessment Note    Patient Details  Name: Sara Hebert MRN: [id number] Date of Birth: [birth year]  Transition of [facility]) CM/SW Contact:    Trenton Founds, RN Phone Number: 08/04/2019, 12:50 PM  Clinical Narrative:  RNCM assessed patient at bedside, patient in recliner with family member and OT present. Patient reports to feeling ok today. Discussed with patient CM role and that she has been recommended for home health PT/OT and some equipment for which she is agreeable too. Patient verbalizes no preference as to which [facility] agency is called as long as they accept her insurance. RNCM contacted Clydie Braun with [facility] and they will be able to accept patient. RNCM contacted Brad with [facility] for equipment.                  Expected Discharge Plan: Home w Home Health Services Barriers to Discharge: Continued Medical Work up   Patient Goals and CMS Choice     Choice offered to / list presented to : Patient, Adult Children  Expected Discharge Plan and Services Expected Discharge Plan: Home w Home Health Services   Discharge Planning Services: CM Consult   Living arrangements for the past 2 months: Single Family Home                 DME Arranged: 3-N-1, Walker rolling DME Agency: [facility] Date DME Agency Contacted: 08/04/19 Time DME Agency Contacted: [id number] Representative spoke with at DME Agency: Brad HH Arranged: PT, OT HH Agency: [facility] Date HH Agency Contacted: 08/04/19 Time HH Agency Contacted: 1145 Representative spoke with at [facility] Agency: Clydie Braun  Prior Living Arrangements/Services Living arrangements for the past 2 months: Single Family Home Lives with:: Self Patient language and need for interpreter reviewed:: Yes Do you feel safe going back to the place where you live?: Yes      Need for Family Participation in Patient Care: Yes (Comment) Care giver support system in place?: Yes (comment)   Criminal  Activity/Legal Involvement Pertinent to Current Situation/Hospitalization: No - Comment as needed  Activities of Daily Living Home Assistive Devices/Equipment: Cane (specify quad or straight), Grab bars in shower ADL Screening (condition at time of admission) Patient's cognitive ability adequate to safely complete daily activities?: Yes Is the patient deaf or have difficulty hearing?: No Does the patient have difficulty seeing, even when wearing glasses/contacts?: No Does the patient have difficulty concentrating, remembering, or making decisions?: No Patient able to express need for assistance with ADLs?: Yes Does the patient have difficulty dressing or bathing?: No Independently performs ADLs?: Yes (appropriate for developmental age) Does the patient have difficulty walking or climbing stairs?: No Weakness of Legs: None Weakness of Arms/Hands: None  Permission Sought/Granted                  Emotional Assessment Appearance:: Appears stated age Attitude/Demeanor/Rapport: Engaged Affect (typically observed): Appropriate Orientation: : Oriented to Self, Oriented to Place, Oriented to  Time, Oriented to Situation Alcohol / Substance Use: Not Applicable Psych Involvement: No (comment)  Admission diagnosis:  Status post total knee replacement using cement, right [Z96.651] Patient Active Problem List   Diagnosis Date Noted  . Status post total knee replacement using cement, right 08/03/2019   PCP:  [facility] Pharmacy:   [facility] - [place], [place] - [address] [place] [address] Phone: [phone] Fax: [phone]     Social Determinants of Health (SDOH)  Interventions    Readmission Risk Interventions No flowsheet data found.

## 2019-08-04 NOTE — Progress Notes (Signed)
Physical Therapy Treatment Patient Details Name: Sara Hebert MRN: [id number] DOB: [birth year] Today's Date: 08/04/2019    History of Present Illness 59 y/o female s/p R TKA 08/03/19.  She has had increasing pain for 7 years and finally needed to have replacement.  Hypothyroidism    PT Comments    Pt did well with POD1 PT session, she continues to have considerable pain but continues to show great motivation and effort with exercises, mobility, ROM and ambulation.  She was able to circumambulate the nurses' station with steadily improving cadence and speed, ROM >90 and increased tolerance with execises (though still needing regular rest breaks secondary to pain between reps/set). Pt making good gains, highly motivated despite pain levels (did coordinate with nursing to see pt ~1 hr post AM pain meds.)   Follow Up Recommendations  Home health PT     Equipment Recommendations  Rolling walker with 5" wheels;3in1 (PT)    Recommendations for Other Services       Precautions / Restrictions Precautions Precautions: Fall;Knee Restrictions Weight Bearing Restrictions: Yes RLE Weight Bearing: Weight bearing as tolerated    Mobility  Bed Mobility Overal bed mobility: Modified Independent             General bed mobility comments: Pt more confident in getting to sitting EOB this date, no assist and minimal cuing  Transfers Overall transfer level: Modified independent Equipment used: Rolling walker (2 wheeled) Transfers: Sit to/from Stand Sit to Stand: Min guard         General transfer comment: Pt needing reminders for set up/positioning as well as appropriate use of walker and UEs.  Heavy UE use but no assist needed to rise to standing.   Ambulation/Gait Ambulation/Gait assistance: Min guard Gait Distance (Feet): 180 Feet Assistive device: Rolling walker (2 wheeled)       General Gait Details: Pt very motivated to go on longer walker this session, ultimately  made it fully around the nurses' station with gradually increasing speed, confidence and cadence.  She intially had mild stop-go cadence, but was able to improve to consistent walker movement.  No buckling in R knee though still relying on walker.   Stairs             Wheelchair Mobility    Modified Rankin (Stroke Patients Only)       Balance Overall balance assessment: Modified Independent;Independent   Sitting balance-Leahy Scale: Good Sitting balance - Comments: Pt able to maintain sitting balance at EOB w/o assist, good confidence   Standing balance support: Bilateral upper extremity supported Standing balance-Leahy Scale: Fair Standing balance comment: continues to rely on walker, but not nearly as heavy as on eval, no LOBs or unsteadiness                            Cognition Arousal/Alertness: Awake/alert Behavior During Therapy: WFL for tasks assessed/performed Overall Cognitive Status: Within Functional Limits for tasks assessed                                        Exercises Total Joint Exercises Ankle Circles/Pumps: AROM;10 reps Quad Sets: Strengthening;15 reps Short Arc Quad: 10 reps;AROM Heel Slides: AROM;Strengthening;5 reps(resisted leg extensions) Hip ABduction/ADduction: Strengthening;10 reps Straight Leg Raises: AROM;Strengthening;10 reps Knee Flexion: PROM;5 reps Goniometric ROM: 1-93    General Comments  Pertinent Vitals/Pain Pain Assessment: 0-10 Pain Score: 3 (at rest, increases significantly with all R LE tasks) Pain Location: R knee, reports more deep in joint than in quad    Home Living                      Prior Function            PT Goals (current goals can now be found in the care plan section) Progress towards PT goals: Progressing toward goals    Frequency    BID      PT Plan Current plan remains appropriate    Co-evaluation              AM-PAC PT "6 Clicks"  Mobility   Outcome Measure  Help needed turning from your back to your side while in a flat bed without using bedrails?: None Help needed moving from lying on your back to sitting on the side of a flat bed without using bedrails?: None Help needed moving to and from a bed to a chair (including a wheelchair)?: A Little Help needed standing up from a chair using your arms (e.g., wheelchair or bedside chair)?: A Little Help needed to walk in hospital room?: A Little Help needed climbing 3-5 steps with a railing? : A Little 6 Click Score: 20    End of Session Equipment Utilized During Treatment: Gait belt Activity Tolerance: Patient limited by pain Patient left: with chair alarm set;with call bell/phone within reach;with family/visitor present Nurse Communication: Mobility status;Patient requests pain meds(needs polar care ice) PT Visit Diagnosis: Muscle weakness (generalized) (M62.81);Difficulty in walking, not elsewhere classified (R26.2);Pain Pain - Right/Left: Right Pain - part of body: Knee     Time: 2229-7989 PT Time Calculation (min) (ACUTE ONLY): 40 min  Charges:  $Gait Training: 8-22 mins $Therapeutic Exercise: 23-37 mins                     Kreg Shropshire, DPT 08/04/2019, 11:06 AM

## 2019-08-04 NOTE — Progress Notes (Signed)
Physical Therapy Treatment Patient Details Name: Sara Hebert MRN: [id number] DOB: [birth year] Today's Date: 08/04/2019    History of Present Illness 59 y/o female s/p R TKA 08/03/19.  She has had increasing pain for 7 years and finally needed to have replacement.  Hypothyroidism    PT Comments    Pt continues to showed great effort and appropriate improvement with all aspects of PT post TKA.  Today she showed increased speed and cadence and was able to circumambulate the nurses station safely both in the AM and PM sessions.  She was also able to negotiate up/down 4 steps X 2 with single L rail and no physical assist. She had some expected fatigue but no issues with safety or otherwise.  Pt's ROM is > 90, she shows consistent quad set strength and overall has achieved goals to be able to safely return home at d/c.  Issued HEP and pt with good understanding of frequency and performance.     Follow Up Recommendations  Home health PT;Follow surgeon's recommendation for DC plan and follow-up therapies     Equipment Recommendations  Rolling walker with 5" wheels;3in1 (PT)    Recommendations for Other Services       Precautions / Restrictions Precautions Precautions: Fall;Knee Precaution Booklet Issued: Yes (comment)(HEP - issued and reviewed) Restrictions Weight Bearing Restrictions: Yes RLE Weight Bearing: Weight bearing as tolerated    Mobility  Bed Mobility Overal bed mobility: Modified Independent             General bed mobility comments: Pt needed extra time to get R LE back into bed from sit to supine, but able to do so and scoot in bed w/o assist  Transfers Overall transfer level: Modified independent Equipment used: Rolling walker (2 wheeled) Transfers: Sit to/from Stand Sit to Stand: Supervision         General transfer comment: Pt more aware of appropraite set up and hand placement/use in getting to standing - does sit a little prematurely on (eager)  return to bed post ambulation  Ambulation/Gait Ambulation/Gait assistance: Min guard Gait Distance (Feet): 250 Feet Assistive device: Rolling walker (2 wheeled)       General Gait Details: Pt continues to show great motivation during ambulation bouts.  She was able to assume consistent cadence and walker momentum much quicker this afternoon and though she had some fatigue (HR into the 120s - O2 remained high 90s) she did not need any standing or sitting rest breaks even when offered and showed increased tolerance and quality of motion with R knee.  Pt still reliant on walker, but much less hesitancy and limp on R.  Very good effort.   Stairs Stairs: Yes Stairs assistance: Supervision Stair Management: One rail Left Number of Stairs: 8 General stair comments: up/down 4 steps X 2 with no rest break.  Able to manage contralateral UE support sideways negotiation safely and without need for assist.  She showed good understanding and sister was present and educated on appropriate sequencing and strategy as well.     Wheelchair Mobility    Modified Rankin (Stroke Patients Only)       Balance Overall balance assessment: Modified Independent;Independent Sitting-balance support: No upper extremity supported   Sitting balance - Comments: Pt able to maintain sitting balance at EOB w/o assist, good confidence   Standing balance support: Bilateral upper extremity supported Standing balance-Leahy Scale: Good Standing balance comment: continues to rely on walker, but improved confidence and no LOBs or unsteadiness  Cognition Arousal/Alertness: Suspect due to medications Behavior During Therapy: WFL for tasks assessed/performed Overall Cognitive Status: Within Functional Limits for tasks assessed                                 General Comments: sleepy, yet highly motivated with all aspects of care      Exercises Total Joint  Exercises Ankle Circles/Pumps: Strengthening;15 reps Quad Sets: Strengthening;15 reps Short Arc Quad: Strengthening;15 reps;AROM Heel Slides: Strengthening;10 reps(resisted leg extensions) Hip ABduction/ADduction: Strengthening;15 reps Straight Leg Raises: AROM;10 reps Knee Flexion: PROM;5 reps Other Exercises Other Exercises: Pt/sister instructed in polar care mgt, compression stocking mgt, falls prevention, pet care considerations, AE/DME, and home/routines modifications to maximize safety andindep with ADL    General Comments        Pertinent Vitals/Pain Pain Assessment: 0-10 Pain Score: 7  Pain Location: R knee Pain Descriptors / Indicators: Aching Pain Intervention(s): Limited activity within patient's tolerance;Monitored during session;Premedicated before session;Repositioned;Ice applied    Home Living Family/patient expects to be discharged to:: Private residence(will be staying at her sister's home) Living Arrangements: Alone(16 y/o granddaughter stays with her) Available Help at Discharge: Family;Available 24 hours/day(will be going home with sister at discharge) Type of Home: House Home Access: Stairs to enter Entrance Stairs-Rails: Left Home Layout: Able to live on main level with bedroom/bathroom Home Equipment: Shower seat - built in;Crutches      Prior Function Level of Independence: Independent with assistive device(s)      Comments: Pt has been using a single crutch recently, able to be independent otherwise   PT Goals (current goals can now be found in the care plan section) Acute Rehab PT Goals Patient Stated Goal: go home Progress towards PT goals: Progressing toward goals    Frequency    BID      PT Plan Current plan remains appropriate    Co-evaluation              AM-PAC PT "6 Clicks" Mobility   Outcome Measure  Help needed turning from your back to your side while in a flat bed without using bedrails?: None Help needed moving from  lying on your back to sitting on the side of a flat bed without using bedrails?: None Help needed moving to and from a bed to a chair (including a wheelchair)?: A Little Help needed standing up from a chair using your arms (e.g., wheelchair or bedside chair)?: A Little Help needed to walk in hospital room?: A Little Help needed climbing 3-5 steps with a railing? : A Little 6 Click Score: 20    End of Session Equipment Utilized During Treatment: Gait belt Activity Tolerance: Patient limited by pain Patient left: with call bell/phone within reach;with family/visitor present;with bed alarm set Nurse Communication: Mobility status PT Visit Diagnosis: Muscle weakness (generalized) (M62.81);Difficulty in walking, not elsewhere classified (R26.2);Pain Pain - Right/Left: Right Pain - part of body: Knee     Time: 9326-7124 PT Time Calculation (min) (ACUTE ONLY): 42 min  Charges:  $Gait Training: 8-22 mins $Therapeutic Exercise: 23-37 mins                     Kreg Shropshire, DPT 08/04/2019, 3:12 PM

## 2019-08-05 MED ORDER — ENOXAPARIN SODIUM 40 MG/0.4ML ~~LOC~~ SOLN: 40.0000 mg | SUBCUTANEOUS | 0 refills | Status: DC

## 2019-08-05 MED ORDER — OXYCODONE HCL 5 MG PO TABS: 5.0000 mg | ORAL_TABLET | ORAL | 0 refills | Status: DC | PRN

## 2019-08-05 MED ORDER — DOCUSATE SODIUM 100 MG PO CAPS: 100.0000 mg | ORAL_CAPSULE | Freq: Two times a day (BID) | ORAL | 0 refills | Status: AC

## 2019-08-05 NOTE — Progress Notes (Signed)
Patient is being discharged to home this morning with HH/PT.  DC, Rx, and Prevena instructions given and patient and daughter acknowledged understanding. Extra cannister for Prevena given as well as honeycomb dressings for when it comes off. IV removed. Daughter is here to transport home.

## 2019-08-05 NOTE — Progress Notes (Signed)
   Subjective: 2 Days Post-Op Procedure(s) (LRB): RIGHT TOTAL KNEE ARTHROPLASTY (Right) Patient reports pain as moderate.   Patient is well, and has had no acute complaints or problems Denies any CP, SOB, ABD pain. We will continue therapy today.  Plan is to go Home after hospital stay.  Objective: Vital signs in last 24 hours: Temp:  [98.2 F (36.8 C)-99.7 F (37.6 C)] 98.4 F (36.9 C) (04/02 2359) Pulse Rate:  [92-100] 100 (04/03 0111) Resp:  [16-18] 16 (04/03 0111) BP: (143-171)/(63-90) 164/83 (04/03 0111) SpO2:  [91 %-100 %] 99 % (04/03 0111)  Intake/Output from previous day: 04/02 0701 - 04/03 0700 In: 240 [P.O.:240] Out: -  Intake/Output this shift: No intake/output data recorded.  Recent Labs    08/03/19 1445 08/04/19 0435  HGB 12.2 11.2*   Recent Labs    08/03/19 1445 08/04/19 0435  WBC 10.4 9.5  RBC 3.91 3.57*  HCT 37.4 34.2*  PLT 228 213   Recent Labs    08/03/19 1445 08/04/19 0435  NA  --  135  K  --  4.2  CL  --  99  CO2  --  27  BUN  --  14  CREATININE 1.09* 1.16*  GLUCOSE  --  131*  CALCIUM  --  8.5*   No results for input(s): LABPT, INR in the last 72 hours.  EXAM General - Patient is Alert, Appropriate and Oriented Extremity - Neurovascular intact Sensation intact distally Intact pulses distally Dorsiflexion/Plantar flexion intact No cellulitis present Compartment soft Dressing - dressing C/D/I.  200 cc drainage of Praveena. Motor Function - intact, moving foot and toes well on exam.   Past Medical History:  Diagnosis Date  . Anxiety   . Arthritis   . Chronic kidney disease    early stages. creatnine recently was normal  . Depression   . Hypertension   . Hypothyroidism     Assessment/Plan:   2 Days Post-Op Procedure(s) (LRB): RIGHT TOTAL KNEE ARTHROPLASTY (Right) Active Problems:   Status post total knee replacement using cement, right  Estimated body mass index is 44.27 kg/m as calculated from the following:    Height as of this encounter: 5\' 5"  (1.651 m).   Weight as of this encounter: 120.7 kg. Advance diet Up with therapy  Needs bowel movement Discharge home with home health PT today pending good progress of physical therapy.  DVT Prophylaxis - Lovenox, Foot Pumps and TED hose Weight-Bearing as tolerated to right leg   T. , PA-C [facility] 08/05/2019, 8:00 AM

## 2019-08-05 NOTE — Plan of Care (Signed)

## 2019-08-05 NOTE — Discharge Instructions (Signed)

## 2019-08-05 NOTE — Progress Notes (Signed)
OT Cancellation Note  Patient Details Name: Sara Hebert MRN: [id number] DOB: 26-Jan-1961   Cancelled Treatment:    Reason Eval/Treat Not Completed: Patient declined, no reason specified. Pt and sister preparing for discharge. Pt denies additional skilled OT needs at this time. Able to recall learned instruction for ADL, compression stocking mgt, and polar care mgt.   Richrd Prime, MPH, MS, OTR/L ascom [phone] 08/05/19, 9:45 AM

## 2019-08-05 NOTE — Discharge Summary (Signed)
Physician Discharge Summary  Patient ID: Sara Hebert MRN: [id number] DOB/AGE: August 16, 1960 59 y.o.  Admit date: 08/03/2019 Discharge date: 08/05/2019  Admission Diagnoses:  Status post total knee replacement using cement, right [Z96.651]   Discharge Diagnoses: Patient Active Problem List   Diagnosis Date Noted  . Status post total knee replacement using cement, right 08/03/2019    Past Medical History:  Diagnosis Date  . Anxiety   . Arthritis   . Chronic kidney disease    early stages. creatnine recently was normal  . Depression   . Hypertension   . Hypothyroidism      Transfusion: None   Consultants (if any):   Discharged Condition: Improved  Hospital Course: Sara Hebert is an 59 y.o. female who was admitted 08/03/2019 with a diagnosis of right knee osteoarthritis and went to the operating room on 08/03/2019 and underwent the above named procedures.    Surgeries: Procedure(s): RIGHT TOTAL KNEE ARTHROPLASTY on 08/03/2019 Patient tolerated the surgery well. Taken to PACU where she was stabilized and then transferred to the orthopedic floor.  Started on Lovenox 30 mg q 12 hrs. Foot pumps applied bilaterally at 80 mm. Heels elevated on bed with rolled towels. No evidence of DVT. Negative Homan. Physical therapy started on day #1 for gait training and transfer. OT started day #1 for ADL and assisted devices.  Patient's foley was d/c on day #1. Patient's IV was d/c on day #2.  On post op day #2 patient was stable and ready for discharge to home with home health PT.  Implants: Medacta GMK Sphere right 2+ femur, right 2 tibia, 2 patella, short stem on tibial component with all components cemented.  She was given perioperative antibiotics:  Anti-infectives (From admission, onward)   Start     Dose/Rate Route Frequency Ordered Stop   08/03/19 1430  ceFAZolin (ANCEF) IVPB 2g/100 mL premix     2 g 200 mL/hr over 30 Minutes Intravenous Every 6 hours 08/03/19  1425 08/04/19 0829   08/03/19 1324  ceFAZolin (ANCEF) 2-4 GM/100ML-% IVPB    Note to Pharmacy: Sharlot Gowda   : cabinet override      08/03/19 1324 08/03/19 1327   08/03/19 0630  ceFAZolin (ANCEF) IVPB 2g/100 mL premix     2 g 200 mL/hr over 30 Minutes Intravenous On call to O.R. 08/03/19 0973 08/03/19 0738   08/03/19 0625  ceFAZolin (ANCEF) 2-4 GM/100ML-% IVPB    Note to Pharmacy: Dellis Filbert   : cabinet override      08/03/19 0625 08/04/19 2000    .  She was given sequential compression devices, early ambulation, and Lovenox, teds for DVT prophylaxis.  She benefited maximally from the hospital stay and there were no complications.    Recent vital signs:  Vitals:   08/04/19 2359 08/05/19 0111  BP: (!) 171/88 (!) 164/83  Pulse: 99 100  Resp: 16 16  Temp: 98.4 F (36.9 C)   SpO2: 98% 99%    Recent laboratory studies:  Lab Results  Component Value Date   HGB 11.2 (L) 08/04/2019   HGB 12.2 08/03/2019   HGB 13.2 08/01/2019   Lab Results  Component Value Date   WBC 9.5 08/04/2019   PLT 213 08/04/2019   No results found for: INR Lab Results  Component Value Date   NA 135 08/04/2019   K 4.2 08/04/2019   CL 99 08/04/2019   CO2 27 08/04/2019   BUN 14 08/04/2019   CREATININE 1.16 (H) 08/04/2019  GLUCOSE 131 (H) 08/04/2019    Discharge Medications:   Allergies as of 08/05/2019   No Known Allergies     Medication List    STOP taking these medications   oxyCODONE-acetaminophen 7.5-325 MG tablet Commonly known as: Percocet     TAKE these medications   buPROPion 300 MG 24 hr tablet Commonly known as: WELLBUTRIN XL Take 300 mg by mouth daily.   citalopram 40 MG tablet Commonly known as: CELEXA Take 40 mg by mouth daily.   colchicine 0.6 MG tablet Take 0.6 mg by mouth daily as needed (gout flares).   diphenhydramine-acetaminophen 25-500 MG Tabs tablet Commonly known as: TYLENOL PM Take 2 tablets by mouth at bedtime as needed (pain/sleep.).   docusate  sodium 100 MG capsule Commonly known as: COLACE Take 1 capsule (100 mg total) by mouth 2 (two) times daily.   enoxaparin 40 MG/0.4ML injection Commonly known as: Lovenox Inject 0.4 mLs (40 mg total) into the skin daily for 14 days.   hydrOXYzine 50 MG tablet Commonly known as: ATARAX/VISTARIL Take 50 mg by mouth 3 (three) times daily as needed (anxiety/sleep.).   levothyroxine 150 MCG tablet Commonly known as: SYNTHROID Take 150 mcg by mouth daily before breakfast.   lisinopril 5 MG tablet Commonly known as: ZESTRIL Take 5 mg by mouth daily.   ondansetron 4 MG tablet Commonly known as: Zofran Take 1 tablet (4 mg total) by mouth every 8 (eight) hours as needed for nausea or vomiting.   oxyCODONE 5 MG immediate release tablet Commonly known as: Oxy IR/ROXICODONE Take 1-2 tablets (5-10 mg total) by mouth every 4 (four) hours as needed for moderate pain (pain score 4-6).   predniSONE 20 MG tablet Commonly known as: DELTASONE 2 tabs po qd            Durable Medical Equipment  (From admission, onward)         Start     Ordered   08/03/19 1426  DME Walker rolling  Once    Question Answer Comment  Walker: With 5 Inch Wheels   Patient needs a walker to treat with the following condition Status post total knee replacement using cement, right      08/03/19 1425   08/03/19 1426  DME 3 n 1  Once     08/03/19 1425   08/03/19 1426  DME Bedside commode  Once    Question:  Patient needs a bedside commode to treat with the following condition  Answer:  Status post total knee replacement using cement, right   08/03/19 1425          Diagnostic Studies: DG Knee 1-2 Views Right  Result Date: 08/03/2019 CLINICAL DATA:  Postop right total knee arthroplasty. EXAM: RIGHT KNEE - 1-2 VIEW COMPARISON:  Preoperative CT 06/27/2019 FINDINGS: Right knee total arthroplasty in expected alignment. No periprosthetic lucency. There has been patellar resurfacing. Recent postsurgical change  includes air and edema in the soft tissues and joint space. Skin staples anteriorly. IMPRESSION: Right knee total arthroplasty without immediate postoperative complication. Electronically Signed   By: Keith Rake M.D.   On: 08/03/2019 10:02    Disposition:     Follow-up Information    Shaquaya, Wuellner, PA-C Follow up in 2 week(s).   Specialties: Orthopedic Surgery, Emergency Medicine Contact information: [place] [address] [phone]            Signed: PRUDENCE, HEINY 08/05/2019, 8:04 AM

## 2019-08-05 NOTE — Progress Notes (Signed)
Physical Therapy Treatment Patient Details Name: Sara Hebert MRN: [id number] DOB: 1960-06-14 Today's Date: 08/05/2019    History of Present Illness 59 y/o female s/p R TKA 08/03/19.  She has had increasing pain for 7 years and finally needed to have replacement.  Hypothyroidism    PT Comments    Participated in exercises as described below.  Completed full lap around unit without difficulty.  Remained in bathroom after session for BM attempt and bathing with sister.  RN aware.  No further questions or concerns.   Follow Up Recommendations  Home health PT;Follow surgeon's recommendation for DC plan and follow-up therapies     Equipment Recommendations  Rolling walker with 5" wheels;3in1 (PT)    Recommendations for Other Services       Precautions / Restrictions Precautions Precautions: Fall;Knee Precaution Booklet Issued: Yes (comment)(HEP - issued and reviewed) Restrictions Weight Bearing Restrictions: Yes RLE Weight Bearing: Weight bearing as tolerated    Mobility  Bed Mobility Overal bed mobility: Modified Independent             General bed mobility comments: Pt needed extra time to get R LE back into bed from sit to supine, but able to do so and scoot in bed w/o assist  Transfers Overall transfer level: Modified independent Equipment used: Rolling walker (2 wheeled) Transfers: Sit to/from Stand Sit to Stand: Supervision            Ambulation/Gait Ambulation/Gait assistance: Min guard;Supervision Gait Distance (Feet): 200 Feet Assistive device: Rolling walker (2 wheeled) Gait Pattern/deviations: Step-through pattern;Step-to pattern;Decreased step length - right;Decreased step length - left Gait velocity: decreased   General Gait Details: irregular pattern but progresses to step-through with time.  generally steady   Stairs         General stair comments: reports being comfortable with stairs - deferred today per pt  choice   Wheelchair Mobility    Modified Rankin (Stroke Patients Only)       Balance Overall balance assessment: Modified Independent;Independent Sitting-balance support: No upper extremity supported Sitting balance-Leahy Scale: Good     Standing balance support: Bilateral upper extremity supported Standing balance-Leahy Scale: Good                              Cognition Arousal/Alertness: Awake/alert Behavior During Therapy: WFL for tasks assessed/performed Overall Cognitive Status: Within Functional Limits for tasks assessed                                        Exercises Total Joint Exercises Ankle Circles/Pumps: Strengthening;15 reps Quad Sets: Strengthening;15 reps Short Arc Quad: Strengthening;15 reps;AROM Heel Slides: Strengthening;10 reps Hip ABduction/ADduction: Strengthening;15 reps Straight Leg Raises: AROM;10 reps Long Arc Quad: AROM;10 reps Knee Flexion: PROM;5 reps Goniometric ROM: 1-89    General Comments        Pertinent Vitals/Pain Pain Assessment: 0-10 Pain Score: 3  Pain Location: R knee Pain Descriptors / Indicators: Aching Pain Intervention(s): Limited activity within patient's tolerance;Monitored during session    Home Living                      Prior Function            PT Goals (current goals can now be found in the care plan section)      Frequency    BID  PT Plan Current plan remains appropriate    Co-evaluation              AM-PAC PT "6 Clicks" Mobility   Outcome Measure  Help needed turning from your back to your side while in a flat bed without using bedrails?: None Help needed moving from lying on your back to sitting on the side of a flat bed without using bedrails?: None Help needed moving to and from a bed to a chair (including a wheelchair)?: None Help needed standing up from a chair using your arms (e.g., wheelchair or bedside chair)?: None Help needed to  walk in hospital room?: A Little Help needed climbing 3-5 steps with a railing? : A Little 6 Click Score: 22    End of Session Equipment Utilized During Treatment: Gait belt Activity Tolerance: Patient limited by pain Patient left: with call bell/phone within reach;with family/visitor present;with bed alarm set Nurse Communication: Mobility status PT Visit Diagnosis: Muscle weakness (generalized) (M62.81);Difficulty in walking, not elsewhere classified (R26.2);Pain Pain - Right/Left: Right Pain - part of body: Knee     Time: 0825-0849 PT Time Calculation (min) (ACUTE ONLY): 24 min  Charges:  $Gait Training: 8-22 mins $Therapeutic Exercise: 8-22 mins                    Danielle Dess, PTA 08/05/19, 10:28 AM

## 2019-08-08 ENCOUNTER — Other Ambulatory Visit: Payer: Self-pay | Admitting: *Deleted

## 2019-08-08 NOTE — Patient Outreach (Signed)
[facility]) Care Management  08/08/2019  Sara Hebert Jun 30, 1960 [id number]    Transition of care telephone call  Referral received:07/07/19 Initial outreach:07/08/19 Insurance: [facility] [employer]   Initial unsuccessful telephone call to patient's preferred number in order to complete transition of care assessment; no answer, left HIPAA compliant voicemail message requesting return call.   Objective: Sara Hebert was hospitalized [facility] from 4/1-08/05/19 for Right knee arthroplasty  Comorbidities include: Osteoarthritis right knee, morbid obesity,  She was discharged to home on 08/05/19 without the need for home health services or DME.   Plan: This RNCM will route unsuccessful outreach letter with [facility] Care Management pamphlet and 24 hour Nurse Advice Line Magnet to [employer] Care Management clinical pool to be mailed to patient's home address. This RNCM will attempt another outreach within 4 business days.  [name], RN, BSN  [facility] Care Management,Care Management Coordinator  [phone]- Mobile [phone]- Toll Free Main Office

## 2019-08-10 ENCOUNTER — Other Ambulatory Visit: Payer: Self-pay | Admitting: *Deleted

## 2019-08-10 NOTE — Patient Outreach (Signed)
[facility]) Care Management  08/10/2019  Korah Hufstedler 07-30-1960 [id number]   Transition of care call Referral received: 07/07/19 Initial outreach attempt:3/6 /21 Insurance: Focus plan    2nd unsuccessful telephone call to patient's preferred contact number in order to complete post hospital discharge transition of care assessment , no answer left HIPAA compliant message requesting return call.    Objective: Trinisha Thomaswas hospitalized [facility] from 4/1-08/05/19 for Right knee arthroplasty  Comorbidities include: Osteoarthritis right knee, morbid obesity,  She was discharged to home on 08/05/19 without the need for home health servicesor DME.   Plan If no return call from patient will attempt 3rd outreach in next 4 business days.    [name], RN, BSN  [facility] Care Management,Care Management Coordinator  [phone]- Mobile [phone]- Toll Free Main Office

## 2019-08-11 ENCOUNTER — Emergency Department
Admission: EM | Admit: 2019-08-11 | Discharge: 2019-08-11 | Disposition: A | Discharge status: Home / Self Care | Payer: No Typology Code available for payment source | Attending: Emergency Medicine | Admitting: Emergency Medicine

## 2019-08-11 ENCOUNTER — Other Ambulatory Visit: Payer: Self-pay

## 2019-08-11 ENCOUNTER — Emergency Department: Payer: No Typology Code available for payment source

## 2019-08-11 ENCOUNTER — Encounter: Payer: Self-pay | Admitting: Emergency Medicine

## 2019-08-11 DIAGNOSIS — Z96651 Presence of right artificial knee joint: Secondary | ICD-10-CM | POA: Diagnosis not present

## 2019-08-11 DIAGNOSIS — E039 Hypothyroidism, unspecified: Secondary | ICD-10-CM | POA: Diagnosis not present

## 2019-08-11 DIAGNOSIS — Z79899 Other long term (current) drug therapy: Secondary | ICD-10-CM | POA: Diagnosis not present

## 2019-08-11 DIAGNOSIS — M25561 Pain in right knee: Secondary | ICD-10-CM | POA: Diagnosis present

## 2019-08-11 DIAGNOSIS — I129 Hypertensive chronic kidney disease with stage 1 through stage 4 chronic kidney disease, or unspecified chronic kidney disease: Secondary | ICD-10-CM | POA: Diagnosis not present

## 2019-08-11 DIAGNOSIS — I82431 Acute embolism and thrombosis of right popliteal vein: Secondary | ICD-10-CM | POA: Insufficient documentation

## 2019-08-11 DIAGNOSIS — N189 Chronic kidney disease, unspecified: Secondary | ICD-10-CM | POA: Diagnosis not present

## 2019-08-11 LAB — COMPREHENSIVE METABOLIC PANEL
ALT: 22 U/L (ref 0–44)
AST: 27 U/L (ref 15–41)
Albumin: 4 g/dL (ref 3.5–5.0)
Alkaline Phosphatase: 81 U/L (ref 38–126)
Anion gap: 9 (ref 5–15)
BUN: 14 mg/dL (ref 6–20)
CO2: 29 mmol/L (ref 22–32)
Calcium: 9.2 mg/dL (ref 8.9–10.3)
Chloride: 97 mmol/L — ABNORMAL LOW (ref 98–111)
Creatinine, Ser: 1.34 mg/dL — ABNORMAL HIGH (ref 0.44–1.00)
GFR calc Af Amer: 51 mL/min — ABNORMAL LOW (ref >60)
GFR calc non Af Amer: 44 mL/min — ABNORMAL LOW (ref >60)
Glucose, Bld: 134 mg/dL — ABNORMAL HIGH (ref 70–99)
Potassium: 4.2 mmol/L (ref 3.5–5.1)
Sodium: 135 mmol/L (ref 135–145)
Total Bilirubin: 1.1 mg/dL (ref 0.3–1.2)
Total Protein: 7.2 g/dL (ref 6.5–8.1)

## 2019-08-11 LAB — SEDIMENTATION RATE: Sed Rate: 81 mm/hr — ABNORMAL HIGH (ref 0–30)

## 2019-08-11 LAB — CBC WITH DIFFERENTIAL/PLATELET
Abs Immature Granulocytes: 0.1 10*3/uL — ABNORMAL HIGH (ref 0.00–0.07)
Basophils Absolute: 0.1 10*3/uL (ref 0.0–0.1)
Basophils Relative: 1 %
Eosinophils Absolute: 0.2 10*3/uL (ref 0.0–0.5)
Eosinophils Relative: 2 %
HCT: 31.4 % — ABNORMAL LOW (ref 36.0–46.0)
Hemoglobin: 10 g/dL — ABNORMAL LOW (ref 12.0–15.0)
Immature Granulocytes: 1 %
Lymphocytes Relative: 30 %
Lymphs Abs: 2.4 10*3/uL (ref 0.7–4.0)
MCH: 31.3 pg (ref 26.0–34.0)
MCHC: 31.8 g/dL (ref 30.0–36.0)
MCV: 98.1 fL (ref 80.0–100.0)
Monocytes Absolute: 0.6 10*3/uL (ref 0.1–1.0)
Monocytes Relative: 8 %
Neutro Abs: 4.7 10*3/uL (ref 1.7–7.7)
Neutrophils Relative %: 58 %
Platelets: 328 10*3/uL (ref 150–400)
RBC: 3.2 MIL/uL — ABNORMAL LOW (ref 3.87–5.11)
RDW: 13.1 % (ref 11.5–15.5)
WBC: 8 10*3/uL (ref 4.0–10.5)
nRBC: 0 % (ref 0.0–0.2)

## 2019-08-11 LAB — PROTIME-INR
INR: 0.9 (ref 0.8–1.2)
Prothrombin Time: 12 seconds (ref 11.4–15.2)

## 2019-08-11 LAB — C-REACTIVE PROTEIN: CRP: 3.8 mg/dL — ABNORMAL HIGH

## 2019-08-11 LAB — LACTIC ACID, PLASMA: Lactic Acid, Venous: 1.3 mmol/L (ref 0.5–1.9)

## 2019-08-11 LAB — APTT: aPTT: 31 seconds (ref 24–36)

## 2019-08-11 MED ORDER — DIPHENHYDRAMINE HCL 25 MG PO CAPS
25.0000 mg | ORAL_CAPSULE | Freq: Once | ORAL | Status: AC
  Administered 2019-08-11: 10:00:00 25 mg via ORAL
  Filled 2019-08-11: qty 1

## 2019-08-11 MED ORDER — APIXABAN 5 MG PO TABS
10.0000 mg | ORAL_TABLET | Freq: Two times a day (BID) | ORAL | Status: DC
  Administered 2019-08-11: 10 mg via ORAL
  Filled 2019-08-11: qty 2

## 2019-08-11 MED ORDER — APIXABAN 5 MG PO TABS: ORAL_TABLET | ORAL | 0 refills | Status: DC

## 2019-08-11 MED ORDER — APIXABAN 5 MG PO TABS: 5.0000 mg | ORAL_TABLET | Freq: Two times a day (BID) | ORAL | Status: DC

## 2019-08-11 MED ORDER — OXYCODONE-ACETAMINOPHEN 5-325 MG PO TABS
2.0000 | ORAL_TABLET | ORAL | Status: AC
  Administered 2019-08-11: 10:00:00 2 via ORAL
  Filled 2019-08-11: qty 2

## 2019-08-11 MED ORDER — APIXABAN 5 MG PO TABS: 10.0000 mg | ORAL_TABLET | Freq: Two times a day (BID) | ORAL | Status: DC

## 2019-08-11 NOTE — ED Triage Notes (Signed)
Pt to triage via w/c, mask in place with no distress noted; pt c/o rt knee pain, swelling & redness; TKR performed 4/1; currently taking lovenox

## 2019-08-11 NOTE — ED Provider Notes (Signed)
[facility] Emergency Department Provider Note   ____________________________________________   First MD Initiated Contact with Patient 08/11/19 [phone]     (approximate)  I have reviewed the triage vital signs and the nursing notes.   HISTORY  Chief Complaint Post-op Problem    HPI Sara Hebert is a 59 y.o. female had a recent knee surgery.  She has been recovering well but started have increasing pain swelling and redness around the right knee starting yesterday.  Contact orthopedics and come to the ER for evaluation today  No fevers chills numbness weakness trouble breathing or chest pain.  No abdominal pain no nausea no vomiting  She reports symptoms are present behind her right knee.  She has been on Lovenox last used yesterday for clot prevention, and takes oxycodone for pain at home last used yesterday  No numbness or weakness in the leg.   Past Medical History:  Diagnosis Date  . Anxiety   . Arthritis   . Chronic kidney disease    early stages. creatnine recently was normal  . Depression   . Hypertension   . Hypothyroidism     Patient Active Problem List   Diagnosis Date Noted  . Status post total knee replacement using cement, right 08/03/2019    Past Surgical History:  Procedure Laterality Date  . ABDOMINAL HYSTERECTOMY    . CHOLECYSTECTOMY    . TOTAL KNEE ARTHROPLASTY Right 08/03/2019   Procedure: RIGHT TOTAL KNEE ARTHROPLASTY;  Surgeon: Kennedy Bucker, MD;  Location: [facility] ORS;  Service: Orthopedics;  Laterality: Right;    Prior to Admission medications   Medication Sig Start Date End Date Taking? Authorizing Provider  apixaban (ELIQUIS) 5 MG TABS tablet Take 2 tablets (10mg ) twice daily for 7 days, then 1 tablet (5mg ) twice daily 08/11/19   , MD  buPROPion (WELLBUTRIN XL) 300 MG 24 hr tablet Take 300 mg by mouth daily.    [provider]  citalopram (CELEXA) 40 MG tablet Take 40 mg by mouth daily.  01/11/19   [provider]  colchicine 0.6 MG tablet Take 0.6 mg by mouth daily as needed (gout flares).    [provider]  diphenhydramine-acetaminophen (TYLENOL PM) 25-500 MG TABS tablet Take 2 tablets by mouth at bedtime as needed (pain/sleep.).    [provider]  docusate sodium (COLACE) 100 MG capsule Take 1 capsule (100 mg total) by mouth 2 (two) times daily. 08/05/19   03/13/19, PA-C  enoxaparin (LOVENOX) 40 MG/0.4ML injection Inject 0.4 mLs (40 mg total) into the skin daily for 14 days. 08/05/19 08/19/19  10/05/19, PA-C  hydrOXYzine (ATARAX/VISTARIL) 50 MG tablet Take 50 mg by mouth 3 (three) times daily as needed (anxiety/sleep.).  02/20/19   [provider]  levothyroxine (SYNTHROID) 150 MCG tablet Take 150 mcg by mouth daily before breakfast.    [provider]  lisinopril (ZESTRIL) 5 MG tablet Take 5 mg by mouth daily. 06/21/19   [provider]  ondansetron (ZOFRAN) 4 MG tablet Take 1 tablet (4 mg total) by mouth every 8 (eight) hours as needed for nausea or vomiting. 11/30/18   06/23/19, PA-C  oxyCODONE (OXY IR/ROXICODONE) 5 MG immediate release tablet Take 1-2 tablets (5-10 mg total) by mouth every 4 (four) hours as needed for moderate pain (pain score 4-6). 08/05/19   Orvil Feil, PA-C  predniSONE (DELTASONE) 20 MG tablet 2 tabs po qd Patient not taking: Reported on 07/24/2019 04/10/19  Norval Gable, MD    Allergies Patient has no known allergies.  Family History  Problem Relation Age of Onset  . Kidney failure Mother   . Healthy Father     Social History Social History   Tobacco Use  . Smoking status: Never Smoker  . Smokeless tobacco: Never Used  Substance Use Topics  . Alcohol use: Not Currently    Comment: occasional mixed drink. 6 in a year!!  . Drug use: Never    Review of Systems Constitutional: No fever/chills Eyes: No visual changes. Cardiovascular: Denies chest pain. Respiratory:  Denies shortness of breath. Gastrointestinal: No abdominal pain.   Genitourinary: Negative for dysuria. Musculoskeletal: Negative for back pain.  No issues with left leg.  Symptoms are primarily around the knee and some swelling in the calf of the right leg Skin: Negative for rash except for feels little warm and red around her right knee. Neurological: Negative for headaches, areas of focal weakness or numbness.    ____________________________________________   PHYSICAL EXAM:  VITAL SIGNS: ED Triage Vitals  Enc Vitals Group     BP 08/11/19 0630 (!) 153/68     Pulse Rate 08/11/19 0630 (!) 101     Resp 08/11/19 0630 18     Temp 08/11/19 0630 98.7 F (37.1 C)     Temp Source 08/11/19 0630 Oral     SpO2 08/11/19 0630 98 %     Weight 08/11/19 0629 266 lb (120.7 kg)     Height 08/11/19 0629 5\' 5"  (1.651 m)     Head Circumference --      Peak Flow --      Pain Score 08/11/19 0629 5     Pain Loc --      Pain Edu? --      Excl. in Gray? --     Constitutional: Alert and oriented. Well appearing and in no acute distress.  Very pleasant. Eyes: Conjunctivae are normal. Head: Atraumatic. Nose: No congestion/rhinnorhea. Mouth/Throat: Mucous membranes are moist. Neck: No stridor.  Cardiovascular: Normal rate, regular rhythm. Grossly normal heart sounds.  Good peripheral circulation. Respiratory: Normal respiratory effort.  No retractions. Lungs CTAB. Gastrointestinal: Soft and nontender. No distention. Musculoskeletal:   Lower Extremities  Normal DP/PT pulses bilateral with good cap refill right leg.  Normal neuro-motor function lower extremities bilateral.  RIGHT Right lower extremity demonstrates normal strength, good use of all muscles except with limitations due to recent postop status. No edema bruising or contusions of the right hip, right knee, right ankle.  Incision anterior over the knee intact, no dehiscence, no purulence or drainage.  There is warmth surrounding the  joint.  There is mild to moderate edema from about the knee down  LEFT Left lower extremity demonstrates normal strength, good use of all muscles. No edema bruising or contusions of the hip,  knee, ankle. Full range of motion of the left lower extremity without pain. No pain on axial loading. No evidence of trauma.   Neurologic:  Normal speech and language. No gross focal neurologic deficits are appreciated.  Skin:  Skin is warm, dry and intact. No rash noted. Psychiatric: Mood and affect are normal. Speech and behavior are normal.  ____________________________________________   LABS (all labs ordered are listed, but only abnormal results are displayed)  Labs Reviewed  CBC WITH DIFFERENTIAL/PLATELET - Abnormal; Notable for the following components:      Result Value   RBC 3.20 (*)    Hemoglobin 10.0 (*)    HCT  31.4 (*)    Abs Immature Granulocytes 0.10 (*)    All other components within normal limits  COMPREHENSIVE METABOLIC PANEL - Abnormal; Notable for the following components:   Chloride 97 (*)    Glucose, Bld 134 (*)    Creatinine, Ser 1.34 (*)    GFR calc non Af Amer 44 (*)    GFR calc Af Amer 51 (*)    All other components within normal limits  SARS CORONAVIRUS 2 (TAT 6-24 HRS)  LACTIC ACID, PLASMA  PROTIME-INR  APTT  LACTIC ACID, PLASMA  SEDIMENTATION RATE  C-REACTIVE PROTEIN   ____________________________________________  EKG   ____________________________________________  RADIOLOGY  US Venous Img Lower Unilateral Right  Result Date: 08/11/2019 CLINICAL DATA:  Right lower extremity pain and edema. History of right total knee replacement on 08/03/2019. Patient is currently on anticoagulation. EXAM: RIGHT LOWER EXTREMITY VENOUS DOPPLER ULTRASOUND TECHNIQUE: Gray-scale sonography with graded compression, as well as color Doppler and duplex ultrasound were performed to evaluate the lower extremity deep venous systems from the level of the common femoral vein and  including the common femoral, femoral, profunda femoral, popliteal and calf veins including the posterior tibial, peroneal and gastrocnemius veins when visible. The superficial great saphenous vein was also interrogated. Spectral Doppler was utilized to evaluate flow at rest and with distal augmentation maneuvers in the common femoral, femoral and popliteal veins. COMPARISON:  None. FINDINGS: Contralateral Common Femoral Vein: Respiratory phasicity is normal and symmetric with the symptomatic side. No evidence of thrombus. Normal compressibility. Common Femoral Vein: No evidence of thrombus. Normal compressibility, respiratory phasicity and response to augmentation. Saphenofemoral Junction: No evidence of thrombus. Normal compressibility and flow on color Doppler imaging. Profunda Femoral Vein: No evidence of thrombus. Normal compressibility and flow on color Doppler imaging. Femoral Vein: No evidence of thrombus. Normal compressibility, respiratory phasicity and response to augmentation. Popliteal Vein: There is hypoechoic occlusive thrombus within the right popliteal vein (image 29 through 33). Calf Veins: Appear patent where imaged. Superficial Great Saphenous Vein: No evidence of thrombus. Normal compressibility. Other Findings:  None. IMPRESSION: Examination is positive for occlusive DVT within the right popliteal vein. Electronically Signed   By: Simonne Come M.D.   On: 08/11/2019 07:48    Imaging reviewed positive for DVT in the right popliteal ____________________________________________   PROCEDURES  Procedure(s) performed: None  Procedures  Critical Care performed: No  ____________________________________________   INITIAL IMPRESSION / ASSESSMENT AND PLAN / ED COURSE  Pertinent labs & imaging results that were available during my care of the patient were reviewed by me and considered in my medical decision making (see chart for details).   Increased pain swelling warmth around and  posterior to her right knee as well as across the calf.  Neurovascular distal exam reassuring.  No fever normal white count.  Question if this is routine postop healing versus possible DVT versus possible joint infection though I certainly do not feel there is compelling evidence of a joint infection by her clinical exam and history  Clinical Course as of Aug 10 1013  Fri Aug 11, 2019  0929 Orthopedics consult placed with Dr. Allena Katz, he asked that we call Dr. Rosita Kea   [MQ]  0930 Case discussed with Ortho (Dr. Rosita Kea). [place] Neighbor will be coming to perform orthopedics consult.    [MQ]  0944 C Gaines (ortho) at bedside   [MQ]  1005 Orthopedics has seen and evaluate the patient.  They advised appears normal postop with regard to joint healing, no  signs of infection.  They do however wish for the patient to be started on Eliquis which I have confirmed with pharmacy dosing and have sent prescription for.  She will follow with orthopedics on Monday.   [MQ]    Clinical Course User Index [MQ] Sharyn Creamer, MD   ----------------------------------------- 10:13 AM on 08/11/2019 -----------------------------------------  Patient exhibits no signs or symptoms of PE.  Patient fully alert oriented.  Reassuring reassessment.  Comfortable with plan for discharge, sister driving her home.  He will be following up Monday with orthopedics.  Return precautions and treatment recommendations and follow-up discussed with the patient who is agreeable with the plan.   ____________________________________________   FINAL CLINICAL IMPRESSION(S) / ED DIAGNOSES  Final diagnoses:  Acute deep vein thrombosis (DVT) of right popliteal vein (HCC)        Note:  This document was prepared using Dragon voice recognition software and may include unintentional dictation errors       Sharyn Creamer, MD 08/11/19 1015

## 2019-08-11 NOTE — Discharge Instructions (Addendum)
Information on my medicine - ELIQUIS (apixaban)  This medication education was reviewed with me or my healthcare representative as part of my discharge preparation.  The pharmacist that spoke with me during my hospital stay was:  Sheema M Hallaji, RPH  Why was Eliquis prescribed for you? Eliquis was prescribed to treat blood clots that may have been found in the veins of your legs (deep vein thrombosis) or in your lungs (pulmonary embolism) and to reduce the risk of them occurring again.  What do You need to know about Eliquis ? The starting dose is 10 mg (two 5 mg tablets) taken TWICE daily for the FIRST SEVEN (7) DAYS, then on 08/18/19  the dose is reduced to ONE 5 mg tablet taken TWICE daily.  Eliquis may be taken with or without food.   Try to take the dose about the same time in the morning and in the evening. If you have difficulty swallowing the tablet whole please discuss with your pharmacist how to take the medication safely.  Take Eliquis exactly as prescribed and DO NOT stop taking Eliquis without talking to the doctor who prescribed the medication.  Stopping may increase your risk of developing a new blood clot.  Refill your prescription before you run out.  After discharge, you should have regular check-up appointments with your healthcare provider that is prescribing your Eliquis.    What do you do if you miss a dose? If a dose of ELIQUIS is not taken at the scheduled time, take it as soon as possible on the same day and twice-daily administration should be resumed. The dose should not be doubled to make up for a missed dose.  Important Safety Information A possible side effect of Eliquis is bleeding. You should call your healthcare provider right away if you experience any of the following: ? Bleeding from an injury or your nose that does not stop. ? Unusual colored urine (red or dark brown) or unusual colored stools (red or black). ? Unusual bruising for unknown  reasons. ? A serious fall or if you hit your head (even if there is no bleeding).  Some medicines may interact with Eliquis and might increase your risk of bleeding or clotting while on Eliquis. To help avoid this, consult your healthcare provider or pharmacist prior to using any new prescription or non-prescription medications, including herbals, vitamins, non-steroidal anti-inflammatory drugs (NSAIDs) and supplements.  This website has more information on Eliquis (apixaban): http://www.eliquis.com/eliquis/home

## 2019-08-11 NOTE — ED Notes (Signed)
Pt to u/s via w/c accomp by u/s tech 

## 2019-08-15 ENCOUNTER — Other Ambulatory Visit: Payer: Self-pay | Admitting: *Deleted

## 2019-08-15 NOTE — Patient Outreach (Signed)
[facility]) Care Management  08/15/2019  Sara Hebert October 07, 1960 [id number]   Transition of care call Referral received: 07/07/19 Initial outreach attempt: 07/08/19 Insurance: [employer] plan   Third unsuccessful telephone call to patient's preferred contact number in order to complete post hospital discharge transition of care assessment; no answer, left HIPAA compliant message requesting return call.   Objective: [facility]4/3/21for Right knee arthroplastyComorbidities include: Osteoarthritis right knee, morbid obesity,  Shewas discharged to home on 4/3/21without the need for home health servicesor DME. Noted ED visit on 07/11/19 Dx: DVT right popliteal vein   Plan: If no return call from patient, will close case to [facility] Care Management services in 10 business days after initial post hospital discharge outreach, on 07/08/19.  [name], RN, BSN  [facility] Care Management,Care Management Coordinator  [phone]- Mobile [phone]- Toll Free Main Office

## 2019-08-16 ENCOUNTER — Ambulatory Visit: Payer: Self-pay | Admitting: *Deleted

## 2019-08-21 ENCOUNTER — Other Ambulatory Visit: Payer: Self-pay | Admitting: *Deleted

## 2019-08-21 NOTE — Patient Outreach (Signed)
[facility]) Care Management  08/21/2019  Sara Hebert 01-24-1961 [id number]   Transition of care /Case Closure Unsuccessful outreach    Referral received:07/07/19 Initial outreach:07/08/19 Insurance:  Focus    Unable to complete post hospital discharge transition of care assessment. No return call form patient after 3 call attempts and no response to request to contact RN Care Coordinator in unsuccessful outreach letter mailed to home on 07/08/19.  Objective: Sara Thomaswas hospitalized [facility] from4/1-4/3/21for Right knee arthroplastyComorbidities include: Osteoarthritis right knee, morbid obesity,  Shewas discharged to home on 4/3/21without the need for home health servicesor DME. Noted ED visit on 07/11/19 Dx: DVT right popliteal vein   Plan Case closed to [facility] care management services as it has been 10 days since initial post discharge outreach attempt.    [name], RN, BSN  [facility] Care Management,Care Management Coordinator  [phone]- Mobile [phone]- Toll Free Main Office

## 2019-09-18 ENCOUNTER — Emergency Department: Payer: No Typology Code available for payment source

## 2019-09-18 ENCOUNTER — Emergency Department
Admission: EM | Admit: 2019-09-18 | Discharge: 2019-09-18 | Disposition: A | Discharge status: Home / Self Care | Payer: No Typology Code available for payment source | Attending: Emergency Medicine | Admitting: Emergency Medicine

## 2019-09-18 DIAGNOSIS — Y92531 Health care provider office as the place of occurrence of the external cause: Secondary | ICD-10-CM | POA: Insufficient documentation

## 2019-09-18 DIAGNOSIS — Z79899 Other long term (current) drug therapy: Secondary | ICD-10-CM | POA: Diagnosis not present

## 2019-09-18 DIAGNOSIS — Z96651 Presence of right artificial knee joint: Secondary | ICD-10-CM | POA: Diagnosis not present

## 2019-09-18 DIAGNOSIS — N189 Chronic kidney disease, unspecified: Secondary | ICD-10-CM | POA: Diagnosis not present

## 2019-09-18 DIAGNOSIS — I129 Hypertensive chronic kidney disease with stage 1 through stage 4 chronic kidney disease, or unspecified chronic kidney disease: Secondary | ICD-10-CM | POA: Diagnosis not present

## 2019-09-18 DIAGNOSIS — Y93A1 Activity, exercise machines primarily for cardiorespiratory conditioning: Secondary | ICD-10-CM | POA: Insufficient documentation

## 2019-09-18 DIAGNOSIS — Z7901 Long term (current) use of anticoagulants: Secondary | ICD-10-CM | POA: Diagnosis not present

## 2019-09-18 DIAGNOSIS — X509XXA Other and unspecified overexertion or strenuous movements or postures, initial encounter: Secondary | ICD-10-CM | POA: Insufficient documentation

## 2019-09-18 DIAGNOSIS — S86911A Strain of unspecified muscle(s) and tendon(s) at lower leg level, right leg, initial encounter: Secondary | ICD-10-CM | POA: Insufficient documentation

## 2019-09-18 DIAGNOSIS — S8991XA Unspecified injury of right lower leg, initial encounter: Secondary | ICD-10-CM | POA: Diagnosis present

## 2019-09-18 DIAGNOSIS — Y999 Unspecified external cause status: Secondary | ICD-10-CM | POA: Insufficient documentation

## 2019-09-18 MED ORDER — MELOXICAM 15 MG PO TABS: 15.0000 mg | ORAL_TABLET | Freq: Every day | ORAL | 0 refills | Status: DC

## 2019-09-18 NOTE — ED Notes (Signed)
Pt from rehab with pain in back of right knee. Pt had TKR on 08/03/2019 and had no problems until today, when she heard and felt a "pop" while using the stationary bike. Pt describes the pain as excruciating. Pt unable to bear weight on knee. Pt alert & oriented, nad noted.

## 2019-09-18 NOTE — ED Triage Notes (Signed)
Pt c/o right posterior leg pain behind the knee. Pt reports she was at physical therapy today on stationary bike when she heard a "pop" behind the right knee. Pt had knee replacement surgery on April 1st to the effected knee.

## 2019-09-18 NOTE — ED Provider Notes (Signed)
[facility] Emergency Department Provider Note  ____________________________________________  Time seen: Approximately 11:18 PM  I have reviewed the triage vital signs and the nursing notes.   HISTORY  Chief Complaint Knee Pain    HPI Sara Hebert is a 59 y.o. female who presents the emergency department for evaluation of right knee pain.  Patient states that she is 6 weeks postop from total knee replacement to the right knee.  She states that today was her first day back to work.  She states that she was ambulating without difficulty and without pain.  She has been doing physical therapy since her surgery and states that today she was on a stationary bike when she felt a sudden sharp pain and a popping sensation in the posterior aspect of her knee.  Patient states that at rest, and without flexion the knee does not hurt.  She states that if she flexes her knee she will have pain in the posterior aspect of the knee.  No swelling.  No direct trauma to the knee.  Patient denies any radicular symptoms.  Patient was diagnosed with a DVT after her knee replacement.  She is still on anticoagulation and denies any edema, erythema, calf pain.  She states that this was secondary to an exercise while in PT.         Past Medical History:  Diagnosis Date  . Anxiety   . Arthritis   . Chronic kidney disease    early stages. creatnine recently was normal  . Depression   . Hypertension   . Hypothyroidism     Patient Active Problem List   Diagnosis Date Noted  . Status post total knee replacement using cement, right 08/03/2019    Past Surgical History:  Procedure Laterality Date  . ABDOMINAL HYSTERECTOMY    . CHOLECYSTECTOMY    . TOTAL KNEE ARTHROPLASTY Right 08/03/2019   Procedure: RIGHT TOTAL KNEE ARTHROPLASTY;  Surgeon: Hessie Knows, MD;  Location: [facility] ORS;  Service: Orthopedics;  Laterality: Right;    Prior to Admission medications   Medication  Sig Start Date End Date Taking? Authorizing Provider  apixaban (ELIQUIS) 5 MG TABS tablet Take 2 tablets (10mg ) twice daily for 7 days, then 1 tablet (5mg ) twice daily 08/11/19   Delman Kitten, MD  buPROPion (WELLBUTRIN XL) 300 MG 24 hr tablet Take 300 mg by mouth daily.    [provider]  citalopram (CELEXA) 40 MG tablet Take 40 mg by mouth daily. 01/11/19   [provider]  colchicine 0.6 MG tablet Take 0.6 mg by mouth daily as needed (gout flares).    [provider]  diphenhydramine-acetaminophen (TYLENOL PM) 25-500 MG TABS tablet Take 2 tablets by mouth at bedtime as needed (pain/sleep.).    [provider]  docusate sodium (COLACE) 100 MG capsule Take 1 capsule (100 mg total) by mouth 2 (two) times daily. 08/05/19   Duanne Guess, PA-C  hydrOXYzine (ATARAX/VISTARIL) 50 MG tablet Take 50 mg by mouth 3 (three) times daily as needed (anxiety/sleep.).  02/20/19   [provider]  levothyroxine (SYNTHROID) 150 MCG tablet Take 150 mcg by mouth daily before breakfast.    [provider]  lisinopril (ZESTRIL) 5 MG tablet Take 5 mg by mouth daily. 06/21/19   [provider]  meloxicam (MOBIC) 15 MG tablet Take 1 tablet (15 mg total) by mouth daily. 09/18/19   Jimi Schappert, Charline Bills, PA-C  ondansetron (ZOFRAN) 4 MG tablet Take 1 tablet (4 mg total)  by mouth every 8 (eight) hours as needed for nausea or vomiting. 11/30/18   Orvil Feil, PA-C  oxyCODONE (OXY IR/ROXICODONE) 5 MG immediate release tablet Take 1-2 tablets (5-10 mg total) by mouth every 4 (four) hours as needed for moderate pain (pain score 4-6). 08/05/19   Evon Slack, PA-C  predniSONE (DELTASONE) 20 MG tablet 2 tabs po qd Patient not taking: Reported on 07/24/2019 04/10/19   Payton Mccallum, MD  enoxaparin (LOVENOX) 40 MG/0.4ML injection Inject 0.4 mLs (40 mg total) into the skin daily for 14 days. 08/05/19 08/11/19  Evon Slack, PA-C    Allergies Patient has no known  allergies.  Family History  Problem Relation Age of Onset  . Kidney failure Mother   . Healthy Father     Social History Social History   Tobacco Use  . Smoking status: Never Smoker  . Smokeless tobacco: Never Used  Substance Use Topics  . Alcohol use: Not Currently    Comment: occasional mixed drink. 6 in a year!!  . Drug use: Never     Review of Systems  Constitutional: No fever/chills Eyes: No visual changes. No discharge ENT: No upper respiratory complaints. Cardiovascular: no chest pain. Respiratory: no cough. No SOB. Gastrointestinal: No abdominal pain.  No nausea, no vomiting.  No diarrhea.  No constipation. Musculoskeletal: Knee pain Skin: Negative for rash, abrasions, lacerations, ecchymosis. Neurological: Negative for headaches, focal weakness or numbness. 10-point ROS otherwise negative.  ____________________________________________   PHYSICAL EXAM:  VITAL SIGNS: ED Triage Vitals  Enc Vitals Group     BP 09/18/19 1914 (!) 148/71     Pulse Rate 09/18/19 1914 (!) 110     Resp 09/18/19 1914 18     Temp 09/18/19 1914 98 F (36.7 C)     Temp Source 09/18/19 1914 Oral     SpO2 09/18/19 1914 100 %     Weight 09/18/19 1913 259 lb (117.5 kg)     Height 09/18/19 1913 5\' 5"  (1.651 m)     Head Circumference --      Peak Flow --      Pain Score 09/18/19 2035 0     Pain Loc --      Pain Edu? --      Excl. in GC? --      Constitutional: Alert and oriented. Well appearing and in no acute distress. Eyes: Conjunctivae are normal. PERRL. EOMI. Head: Atraumatic. ENT:      Ears:       Nose: No congestion/rhinnorhea.      Mouth/Throat: Mucous membranes are moist.  Neck: No stridor.    Cardiovascular: Normal rate, regular rhythm. Normal S1 and S2.  Good peripheral circulation. Respiratory: Normal respiratory effort without tachypnea or retractions. Lungs CTAB. Good air entry to the bases with no decreased or absent breath sounds. Musculoskeletal: Full range of  motion to all extremities. No gross deformities appreciated.  Visualization of the right leg reveals no edema, erythema, ecchymosis, deformity.  Patient is nontender to palpation over the osseous structures of the anterior knee.  No ballottement.  Patient has good range of motion, however does have some limit given recent knee replacement and pain.  Patient is mildly tender palpation along the distal aspect of the quadriceps with no palpable abnormality or deformity.  No ecchymosis in this region.  No other tenderness to palpation.  Dorsalis pedis pulse and sensation intact distally. Neurologic:  Normal speech and language. No gross focal neurologic deficits are appreciated.  Skin:  Skin is warm, dry and intact. No rash noted. Psychiatric: Mood and affect are normal. Speech and behavior are normal. Patient exhibits appropriate insight and judgement.   ____________________________________________   LABS (all labs ordered are listed, but only abnormal results are displayed)  Labs Reviewed - No data to display ____________________________________________  EKG   ____________________________________________  RADIOLOGY I personally viewed and evaluated these images as part of my medical decision making, as well as reviewing the written report by the radiologist.  DG Knee Complete 4 Views Right  Result Date: 09/18/2019 CLINICAL DATA:  59 year old female was at physical therapy today on stationary bike when she heard a "pop" and has pain behind the knee. Status post knee replacement surgery last month. EXAM: RIGHT KNEE - COMPLETE 4+ VIEW COMPARISON:  Postoperative right knee radiographs 08/03/2019. FINDINGS: Right knee total arthroplasty hardware appears stable and intact. No evidence of loosening. Stable postoperative changes to the patella. No joint effusion is evident. No acute fracture identified. Stable postoperative appearance of the distal femur, proximal tibia and fibula. No discrete soft  tissue abnormality. IMPRESSION: No acute osseous abnormality identified. Stable radiographic appearance of right total knee arthroplasty. Electronically Signed   By: Odessa Fleming M.D.   On: 09/18/2019 19:40    ____________________________________________    PROCEDURES  Procedure(s) performed:    Procedures    Medications - No data to display   ____________________________________________   INITIAL IMPRESSION / ASSESSMENT AND PLAN / ED COURSE  Pertinent labs & imaging results that were available during my care of the patient were reviewed by me and considered in my medical decision making (see chart for details).  Review of the [place] CSRS was performed in accordance of the [facility] prior to dispensing any controlled drugs.           Patient's diagnosis is consistent with knee strain.  Patient presented to emergency department following pain after performing physical therapy.  Patient had a total knee replacement 6 weeks ago.  Patient is been doing well.  States that she was ambulating today at work without difficulty and without pain.  She was on a stationary bike at PT when she felt a sudden sharp pain and believes she heard a popping sound.  Patient is having pain over the distal quadricep with no palpable abnormality.  No ecchymosis.  No loss of range of motion from her baseline.  At this time x-ray is reassuring.  I do not feel like further imaging at this time is necessary and will place the patient on a short course of anti-inflammatory.  I instructed the patient to follow-up with her orthopedic surgeon. Patient is given ED precautions to return to the ED for any worsening or new symptoms.     ____________________________________________  FINAL CLINICAL IMPRESSION(S) / ED DIAGNOSES  Final diagnoses:  Strain of right knee, initial encounter      NEW MEDICATIONS STARTED DURING THIS VISIT:  ED Discharge Orders         Ordered    meloxicam (MOBIC) 15 MG tablet  Daily      09/18/19 2319              This chart was dictated using voice recognition software/Dragon. Despite best efforts to proofread, errors can occur which can change the meaning. Any change was purely unintentional.    Racheal Patches, PA-C 09/18/19 2347    Concha Se, MD 09/19/19 [phone]

## 2019-11-08 ENCOUNTER — Other Ambulatory Visit: Payer: Self-pay | Admitting: Internal Medicine

## 2019-12-13 ENCOUNTER — Other Ambulatory Visit: Payer: Self-pay | Admitting: Internal Medicine

## 2019-12-13 DIAGNOSIS — Z1231 Encounter for screening mammogram for malignant neoplasm of breast: Secondary | ICD-10-CM

## 2020-01-02 ENCOUNTER — Ambulatory Visit
Admission: RE | Admit: 2020-01-02 | Discharge: 2020-01-02 | Disposition: A | Discharge status: Home / Self Care | Payer: No Typology Code available for payment source | Source: Ambulatory Visit | Attending: Internal Medicine | Admitting: Internal Medicine

## 2020-01-02 ENCOUNTER — Other Ambulatory Visit: Payer: Self-pay

## 2020-01-02 DIAGNOSIS — Z1231 Encounter for screening mammogram for malignant neoplasm of breast: Secondary | ICD-10-CM | POA: Diagnosis not present

## 2020-01-05 ENCOUNTER — Other Ambulatory Visit: Payer: Self-pay | Admitting: *Deleted

## 2020-01-05 ENCOUNTER — Inpatient Hospital Stay
Admission: RE | Admit: 2020-01-05 | Discharge: 2020-01-05 | Disposition: A | Discharge status: Home / Self Care | Payer: Self-pay | Source: Ambulatory Visit | Attending: *Deleted | Admitting: *Deleted

## 2020-01-05 DIAGNOSIS — Z1231 Encounter for screening mammogram for malignant neoplasm of breast: Secondary | ICD-10-CM

## 2020-01-22 ENCOUNTER — Other Ambulatory Visit: Payer: Self-pay | Admitting: Orthopedic Surgery

## 2020-01-22 ENCOUNTER — Other Ambulatory Visit: Payer: Self-pay | Admitting: Internal Medicine

## 2020-01-22 DIAGNOSIS — M1712 Unilateral primary osteoarthritis, left knee: Secondary | ICD-10-CM

## 2020-01-30 ENCOUNTER — Ambulatory Visit
Admission: RE | Admit: 2020-01-30 | Discharge: 2020-01-30 | Disposition: A | Discharge status: Home / Self Care | Payer: No Typology Code available for payment source | Source: Ambulatory Visit | Attending: Orthopedic Surgery | Admitting: Orthopedic Surgery

## 2020-01-30 ENCOUNTER — Other Ambulatory Visit: Payer: Self-pay

## 2020-01-30 DIAGNOSIS — M1712 Unilateral primary osteoarthritis, left knee: Secondary | ICD-10-CM | POA: Diagnosis not present

## 2020-03-04 ENCOUNTER — Other Ambulatory Visit: Payer: Self-pay | Admitting: Internal Medicine

## 2020-03-25 ENCOUNTER — Other Ambulatory Visit: Payer: Self-pay | Admitting: Internal Medicine

## 2020-06-04 ENCOUNTER — Other Ambulatory Visit: Payer: Self-pay | Admitting: Internal Medicine

## 2020-06-04 ENCOUNTER — Inpatient Hospital Stay: Admit: 2020-06-04 | Payer: No Typology Code available for payment source | Admitting: Orthopedic Surgery

## 2020-06-04 SURGERY — ARTHROPLASTY, KNEE, TOTAL
Anesthesia: Choice | Site: Knee | Laterality: Left

## 2020-06-12 ENCOUNTER — Other Ambulatory Visit: Payer: Self-pay | Admitting: Internal Medicine

## 2020-07-08 ENCOUNTER — Other Ambulatory Visit: Payer: Self-pay | Admitting: Orthopedic Surgery

## 2020-07-10 ENCOUNTER — Other Ambulatory Visit: Payer: Self-pay | Admitting: Internal Medicine

## 2020-07-22 ENCOUNTER — Other Ambulatory Visit: Payer: Self-pay

## 2020-07-22 ENCOUNTER — Other Ambulatory Visit
Admission: RE | Admit: 2020-07-22 | Discharge: 2020-07-22 | Disposition: A | Discharge status: Home / Self Care | Payer: No Typology Code available for payment source | Source: Ambulatory Visit | Attending: Orthopedic Surgery | Admitting: Orthopedic Surgery

## 2020-07-22 DIAGNOSIS — Z01812 Encounter for preprocedural laboratory examination: Secondary | ICD-10-CM | POA: Diagnosis present

## 2020-07-22 DIAGNOSIS — Z01810 Encounter for preprocedural cardiovascular examination: Secondary | ICD-10-CM

## 2020-07-22 HISTORY — DX: Gastro-esophageal reflux disease without esophagitis: K21.9

## 2020-07-22 LAB — CBC WITH DIFFERENTIAL/PLATELET
Abs Immature Granulocytes: 0.01 10*3/uL (ref 0.00–0.07)
Basophils Absolute: 0 10*3/uL (ref 0.0–0.1)
Basophils Relative: 1 %
Eosinophils Absolute: 0.1 10*3/uL (ref 0.0–0.5)
Eosinophils Relative: 2 %
HCT: 38 % (ref 36.0–46.0)
Hemoglobin: 12.7 g/dL (ref 12.0–15.0)
Immature Granulocytes: 0 %
Lymphocytes Relative: 34 %
Lymphs Abs: 1.4 10*3/uL (ref 0.7–4.0)
MCH: 30.8 pg (ref 26.0–34.0)
MCHC: 33.4 g/dL (ref 30.0–36.0)
MCV: 92.2 fL (ref 80.0–100.0)
Monocytes Absolute: 0.3 10*3/uL (ref 0.1–1.0)
Monocytes Relative: 8 %
Neutro Abs: 2.2 10*3/uL (ref 1.7–7.7)
Neutrophils Relative %: 55 %
Platelets: 225 10*3/uL (ref 150–400)
RBC: 4.12 MIL/uL (ref 3.87–5.11)
RDW: 12.9 % (ref 11.5–15.5)
WBC: 4 10*3/uL (ref 4.0–10.5)
nRBC: 0 % (ref 0.0–0.2)

## 2020-07-22 LAB — URINALYSIS, ROUTINE W REFLEX MICROSCOPIC
Bilirubin Urine: NEGATIVE
Glucose, UA: NEGATIVE mg/dL
Ketones, ur: NEGATIVE mg/dL
Leukocytes,Ua: NEGATIVE
Nitrite: NEGATIVE
Protein, ur: NEGATIVE mg/dL
Specific Gravity, Urine: 1.013 (ref 1.005–1.030)
pH: 6 (ref 5.0–8.0)

## 2020-07-22 LAB — TYPE AND SCREEN
ABO/RH(D): O POS
Antibody Screen: NEGATIVE

## 2020-07-22 LAB — COMPREHENSIVE METABOLIC PANEL
ALT: 15 U/L (ref 0–44)
AST: 19 U/L (ref 15–41)
Albumin: 4.2 g/dL (ref 3.5–5.0)
Alkaline Phosphatase: 104 U/L (ref 38–126)
Anion gap: 6 (ref 5–15)
BUN: 13 mg/dL (ref 6–20)
CO2: 26 mmol/L (ref 22–32)
Calcium: 9 mg/dL (ref 8.9–10.3)
Chloride: 104 mmol/L (ref 98–111)
Creatinine, Ser: 1.11 mg/dL — ABNORMAL HIGH (ref 0.44–1.00)
GFR, Estimated: 58 mL/min — ABNORMAL LOW (ref >60)
Glucose, Bld: 96 mg/dL (ref 70–99)
Potassium: 3.6 mmol/L (ref 3.5–5.1)
Sodium: 136 mmol/L (ref 135–145)
Total Bilirubin: 1.2 mg/dL (ref 0.3–1.2)
Total Protein: 7.2 g/dL (ref 6.5–8.1)

## 2020-07-22 LAB — SURGICAL PCR SCREEN
MRSA, PCR: NEGATIVE
Staphylococcus aureus: NEGATIVE

## 2020-07-22 NOTE — Patient Instructions (Signed)
Your procedure is scheduled on: Thursday 08/01/20.   Report to THE FIRST FLOOR REGISTRATION DESK IN THE MEDICAL MALL ON THE MORNING OF SURGERY FIRST, THEN YOU WILL CHECK IN AT THE SURGERY INFORMATION DESK LOCATED OUTSIDE THE SAME DAY SURGERY DEPARTMENT LOCATED ON 2ND FLOOR MEDICAL MALL ENTRANCE.  To find out your arrival time please call [phone] between 1PM - 3PM on Wednesday 07/31/20.   Remember: Instructions that are not followed completely may result in serious medical risk, up to and including death, or upon the discretion of your surgeon and anesthesiologist your surgery may need to be rescheduled.     __X__ 1. Do not eat food after midnight the night before your procedure.                 No gum chewing or hard candies. You may drink clear liquids up to 2 hours                 before you are scheduled to arrive for your surgery- DO NOT drink clear                 liquids within 2 hours of the start of your surgery.                 Clear Liquids include:  water, apple juice without pulp, clear carbohydrate                 drink such as Clearfast or Gatorade, Black Coffee or Tea (Do not add                 milk or creamer to coffee or tea).  __X__2.  On the morning of surgery brush your teeth with toothpaste and water, you may rinse your mouth with mouthwash if you wish.  Do not swallow any toothpaste or mouthwash.    __X__ 3.  No Alcohol for 24 hours before or after surgery.  __X__ 4.  Do Not Smoke or use e-cigarettes For 24 Hours Prior to Your Surgery.                 Do not use any chewable tobacco products for at least 6 hours prior to                 surgery.  __X__5.  Notify your doctor if there is any change in your medical condition      (cold, fever, infections).      Do NOT wear jewelry, make-up, hairpins, clips or nail polish. Do NOT wear lotions, powders, or perfumes.  Do NOT shave 48 hours prior to surgery. Men may shave face and neck. Do NOT bring valuables to  the hospital.     [facility] is not responsible for any belongings or valuables.   Contacts, dentures/partials or body piercings may not be worn into surgery. Bring a case for your contacts, glasses or hearing aids, a denture cup will be supplied.  Leave your suitcase in the car. After surgery it may be brought to your room.   For patients admitted to the hospital, discharge time is determined by your treatment team.   __X__ Take these medicines the morning of surgery with A SIP OF WATER:     1. None      __X__ Use CHG Soap as directed.  __X__ Stop Anti-inflammatories 7 days before surgery such as Advil, Ibuprofen, Motrin, BC or Goodies Powder, Naprosyn, Naproxen, Aleve, Aspirin, Meloxicam. May take Tylenol if needed  for pain or discomfort.   __X__Do not start taking any new herbal supplements or vitamins prior to your procedure.     Wear comfortable clothing (specific to your surgery type) to the hospital.  Plan for stool softeners for home use; pain medications have a tendency to cause constipation. You can also help prevent constipation by eating foods high in fiber such as fruits and vegetables and drinking plenty of fluids as your diet allows.  After surgery, you can prevent lung complications by doing breathing exercises.Take deep breaths and cough every 1-2 hours. Your doctor may order a device called an Incentive Spirometer to help you take deep breaths.  Please call the [place] Department at [phone] if you have any questions about these instructions.

## 2020-07-30 ENCOUNTER — Other Ambulatory Visit
Admission: RE | Admit: 2020-07-30 | Discharge: 2020-07-30 | Disposition: A | Discharge status: Home / Self Care | Payer: No Typology Code available for payment source | Source: Ambulatory Visit | Attending: Orthopedic Surgery | Admitting: Orthopedic Surgery

## 2020-07-30 ENCOUNTER — Other Ambulatory Visit: Payer: Self-pay

## 2020-07-30 DIAGNOSIS — Z01812 Encounter for preprocedural laboratory examination: Secondary | ICD-10-CM | POA: Insufficient documentation

## 2020-07-30 DIAGNOSIS — Z20822 Contact with and (suspected) exposure to covid-19: Secondary | ICD-10-CM | POA: Insufficient documentation

## 2020-07-30 LAB — SARS CORONAVIRUS 2 (TAT 6-24 HRS): SARS Coronavirus 2: NEGATIVE

## 2020-07-31 MED ORDER — ORAL CARE MOUTH RINSE: 15.0000 mL | Freq: Once | OROMUCOSAL | Status: AC

## 2020-07-31 MED ORDER — FAMOTIDINE 20 MG PO TABS: 20.0000 mg | ORAL_TABLET | Freq: Once | ORAL | Status: AC

## 2020-07-31 MED ORDER — LACTATED RINGERS IV SOLN: INTRAVENOUS | Status: DC

## 2020-07-31 MED ORDER — CHLORHEXIDINE GLUCONATE 0.12 % MT SOLN: 15.0000 mL | Freq: Once | OROMUCOSAL | Status: AC

## 2020-07-31 MED ORDER — CEFAZOLIN SODIUM-DEXTROSE 2-4 GM/100ML-% IV SOLN
2.0000 g | INTRAVENOUS | Status: AC
  Administered 2020-08-01: 2 g via INTRAVENOUS

## 2020-08-01 ENCOUNTER — Inpatient Hospital Stay: Payer: No Typology Code available for payment source | Admitting: Certified Registered Nurse Anesthetist

## 2020-08-01 ENCOUNTER — Inpatient Hospital Stay: Payer: No Typology Code available for payment source

## 2020-08-01 ENCOUNTER — Encounter
Admission: RE | Disposition: A | Discharge status: Home / Self Care | Payer: Self-pay | Source: Home / Self Care | Attending: Orthopedic Surgery

## 2020-08-01 ENCOUNTER — Encounter: Payer: Self-pay | Admitting: Orthopedic Surgery

## 2020-08-01 ENCOUNTER — Other Ambulatory Visit: Payer: Self-pay

## 2020-08-01 ENCOUNTER — Inpatient Hospital Stay
Admission: RE | Admit: 2020-08-01 | Discharge: 2020-08-03 | DRG: 470 | Disposition: A | Discharge status: Home / Self Care | Payer: No Typology Code available for payment source | Attending: Orthopedic Surgery | Admitting: Orthopedic Surgery

## 2020-08-01 DIAGNOSIS — G8918 Other acute postprocedural pain: Secondary | ICD-10-CM

## 2020-08-01 DIAGNOSIS — Z20822 Contact with and (suspected) exposure to covid-19: Secondary | ICD-10-CM | POA: Diagnosis present

## 2020-08-01 DIAGNOSIS — M1712 Unilateral primary osteoarthritis, left knee: Secondary | ICD-10-CM | POA: Diagnosis present

## 2020-08-01 DIAGNOSIS — E039 Hypothyroidism, unspecified: Secondary | ICD-10-CM | POA: Diagnosis present

## 2020-08-01 DIAGNOSIS — Z96651 Presence of right artificial knee joint: Secondary | ICD-10-CM | POA: Diagnosis present

## 2020-08-01 DIAGNOSIS — Z79899 Other long term (current) drug therapy: Secondary | ICD-10-CM | POA: Diagnosis not present

## 2020-08-01 DIAGNOSIS — I10 Essential (primary) hypertension: Secondary | ICD-10-CM | POA: Diagnosis present

## 2020-08-01 DIAGNOSIS — Z96652 Presence of left artificial knee joint: Secondary | ICD-10-CM

## 2020-08-01 DIAGNOSIS — F32A Depression, unspecified: Secondary | ICD-10-CM | POA: Diagnosis present

## 2020-08-01 HISTORY — PX: TOTAL KNEE ARTHROPLASTY: SHX125

## 2020-08-01 SURGERY — ARTHROPLASTY, KNEE, TOTAL
Anesthesia: Spinal | Site: Knee | Laterality: Left

## 2020-08-01 MED ORDER — LEVOTHYROXINE SODIUM 50 MCG PO TABS
150.0000 ug | ORAL_TABLET | Freq: Every day | ORAL | Status: DC
  Administered 2020-08-02 – 2020-08-03 (×2): 150 ug via ORAL
  Filled 2020-08-01 (×2): qty 3

## 2020-08-01 MED ORDER — LIDOCAINE HCL (PF) 1 % IJ SOLN
INTRAMUSCULAR | Status: DC | PRN
  Administered 2020-08-01 (×2): 3 mL

## 2020-08-01 MED ORDER — PHENOL 1.4 % MT LIQD
1.0000 | OROMUCOSAL | Status: DC | PRN
  Filled 2020-08-01: qty 177

## 2020-08-01 MED ORDER — FENTANYL CITRATE (PF) 100 MCG/2ML IJ SOLN: 25.0000 ug | INTRAMUSCULAR | Status: DC | PRN

## 2020-08-01 MED ORDER — LIDOCAINE HCL (PF) 2 % IJ SOLN
INTRAMUSCULAR | Status: AC
  Filled 2020-08-01: qty 5

## 2020-08-01 MED ORDER — APIXABAN 5 MG PO TABS
5.0000 mg | ORAL_TABLET | Freq: Two times a day (BID) | ORAL | Status: DC
  Administered 2020-08-02 – 2020-08-03 (×3): 5 mg via ORAL
  Filled 2020-08-01 (×3): qty 1

## 2020-08-01 MED ORDER — METHOCARBAMOL 1000 MG/10ML IJ SOLN
500.0000 mg | Freq: Four times a day (QID) | INTRAVENOUS | Status: DC | PRN
  Filled 2020-08-01: qty 5

## 2020-08-01 MED ORDER — BUPIVACAINE LIPOSOME 1.3 % IJ SUSP
INTRAMUSCULAR | Status: AC
  Filled 2020-08-01: qty 20

## 2020-08-01 MED ORDER — CEFAZOLIN SODIUM-DEXTROSE 2-4 GM/100ML-% IV SOLN
INTRAVENOUS | Status: AC
  Filled 2020-08-01: qty 100

## 2020-08-01 MED ORDER — PROPOFOL 500 MG/50ML IV EMUL
INTRAVENOUS | Status: DC | PRN
  Administered 2020-08-01: 100 ug/kg/min via INTRAVENOUS

## 2020-08-01 MED ORDER — SODIUM CHLORIDE 0.9 % IV SOLN: INTRAVENOUS | Status: DC | PRN

## 2020-08-01 MED ORDER — MAGNESIUM HYDROXIDE 400 MG/5ML PO SUSP
30.0000 mL | Freq: Every day | ORAL | Status: DC | PRN
  Administered 2020-08-03: 30 mL via ORAL
  Filled 2020-08-01: qty 30

## 2020-08-01 MED ORDER — DIPHENHYDRAMINE HCL 50 MG/ML IJ SOLN
25.0000 mg | Freq: Once | INTRAMUSCULAR | Status: AC
  Administered 2020-08-01: 25 mg via INTRAVENOUS

## 2020-08-01 MED ORDER — ONDANSETRON HCL 4 MG PO TABS: 4.0000 mg | ORAL_TABLET | Freq: Four times a day (QID) | ORAL | Status: DC | PRN

## 2020-08-01 MED ORDER — BUPIVACAINE HCL (PF) 0.5 % IJ SOLN
INTRAMUSCULAR | Status: DC | PRN
  Administered 2020-08-01: 2.5 mL

## 2020-08-01 MED ORDER — ACETAMINOPHEN 10 MG/ML IV SOLN
INTRAVENOUS | Status: DC | PRN
  Administered 2020-08-01: 1000 mg via INTRAVENOUS

## 2020-08-01 MED ORDER — MIDAZOLAM HCL 2 MG/2ML IJ SOLN
INTRAMUSCULAR | Status: AC
  Filled 2020-08-01: qty 2

## 2020-08-01 MED ORDER — SODIUM CHLORIDE 0.9 % IV SOLN: INTRAVENOUS | Status: DC

## 2020-08-01 MED ORDER — BUPROPION HCL ER (XL) 150 MG PO TB24
300.0000 mg | ORAL_TABLET | Freq: Every day | ORAL | Status: DC
  Administered 2020-08-02 – 2020-08-03 (×2): 300 mg via ORAL
  Filled 2020-08-01 (×2): qty 2

## 2020-08-01 MED ORDER — MAGNESIUM CITRATE PO SOLN
1.0000 | Freq: Once | ORAL | Status: DC | PRN
  Filled 2020-08-01: qty 296

## 2020-08-01 MED ORDER — DIPHENHYDRAMINE HCL 25 MG PO CAPS
50.0000 mg | ORAL_CAPSULE | Freq: Every evening | ORAL | Status: DC | PRN
  Filled 2020-08-01: qty 2

## 2020-08-01 MED ORDER — DIPHENHYDRAMINE-APAP (SLEEP) 25-500 MG PO TABS: 2.0000 | ORAL_TABLET | Freq: Every evening | ORAL | Status: DC | PRN

## 2020-08-01 MED ORDER — MENTHOL 3 MG MT LOZG
1.0000 | LOZENGE | OROMUCOSAL | Status: DC | PRN
  Filled 2020-08-01: qty 9

## 2020-08-01 MED ORDER — PROPOFOL 500 MG/50ML IV EMUL
INTRAVENOUS | Status: AC
  Filled 2020-08-01: qty 50

## 2020-08-01 MED ORDER — LISINOPRIL 5 MG PO TABS
5.0000 mg | ORAL_TABLET | Freq: Every day | ORAL | Status: DC
  Administered 2020-08-02 – 2020-08-03 (×2): 5 mg via ORAL
  Filled 2020-08-01 (×3): qty 1

## 2020-08-01 MED ORDER — CITALOPRAM HYDROBROMIDE 20 MG PO TABS
40.0000 mg | ORAL_TABLET | Freq: Every day | ORAL | Status: DC
  Administered 2020-08-01 – 2020-08-03 (×3): 40 mg via ORAL
  Filled 2020-08-01 (×3): qty 2

## 2020-08-01 MED ORDER — SODIUM CHLORIDE 0.9 % IV SOLN
INTRAVENOUS | Status: DC | PRN
  Administered 2020-08-01: 10 ug/min via INTRAVENOUS

## 2020-08-01 MED ORDER — ACETAMINOPHEN 500 MG PO TABS: 1000.0000 mg | ORAL_TABLET | Freq: Every evening | ORAL | Status: DC | PRN

## 2020-08-01 MED ORDER — BUPIVACAINE-EPINEPHRINE (PF) 0.25% -1:200000 IJ SOLN
INTRAMUSCULAR | Status: AC
  Filled 2020-08-01: qty 30

## 2020-08-01 MED ORDER — SODIUM CHLORIDE FLUSH 0.9 % IV SOLN
INTRAVENOUS | Status: AC
  Filled 2020-08-01: qty 40

## 2020-08-01 MED ORDER — CHLORHEXIDINE GLUCONATE 0.12 % MT SOLN
OROMUCOSAL | Status: AC
  Administered 2020-08-01: 15 mL via OROMUCOSAL
  Filled 2020-08-01: qty 15

## 2020-08-01 MED ORDER — DIPHENHYDRAMINE HCL 25 MG PO CAPS
50.0000 mg | ORAL_CAPSULE | Freq: Four times a day (QID) | ORAL | Status: DC | PRN
  Administered 2020-08-01 – 2020-08-03 (×5): 50 mg via ORAL
  Filled 2020-08-01 (×4): qty 2

## 2020-08-01 MED ORDER — METOCLOPRAMIDE HCL 10 MG PO TABS: 5.0000 mg | ORAL_TABLET | Freq: Three times a day (TID) | ORAL | Status: DC | PRN

## 2020-08-01 MED ORDER — BUPIVACAINE-EPINEPHRINE (PF) 0.25% -1:200000 IJ SOLN
INTRAMUSCULAR | Status: DC | PRN
  Administered 2020-08-01: 30 mL

## 2020-08-01 MED ORDER — MORPHINE SULFATE (PF) 10 MG/ML IV SOLN
INTRAVENOUS | Status: AC
  Filled 2020-08-01: qty 1

## 2020-08-01 MED ORDER — CEFAZOLIN SODIUM-DEXTROSE 2-4 GM/100ML-% IV SOLN
2.0000 g | Freq: Four times a day (QID) | INTRAVENOUS | Status: AC
  Administered 2020-08-01 (×2): 2 g via INTRAVENOUS
  Filled 2020-08-01 (×2): qty 100

## 2020-08-01 MED ORDER — SEVOFLURANE IN SOLN
RESPIRATORY_TRACT | Status: AC
  Filled 2020-08-01: qty 250

## 2020-08-01 MED ORDER — NEOMYCIN-POLYMYXIN B GU 40-200000 IR SOLN
Status: AC
  Filled 2020-08-01: qty 20

## 2020-08-01 MED ORDER — ALUM & MAG HYDROXIDE-SIMETH 200-200-20 MG/5ML PO SUSP: 30.0000 mL | ORAL | Status: DC | PRN

## 2020-08-01 MED ORDER — METHOCARBAMOL 500 MG PO TABS
500.0000 mg | ORAL_TABLET | Freq: Four times a day (QID) | ORAL | Status: DC | PRN
  Administered 2020-08-01 – 2020-08-03 (×6): 500 mg via ORAL
  Filled 2020-08-01 (×6): qty 1

## 2020-08-01 MED ORDER — HYDROMORPHONE HCL 2 MG PO TABS
2.0000 mg | ORAL_TABLET | ORAL | Status: DC | PRN
  Administered 2020-08-01 – 2020-08-02 (×4): 2 mg via ORAL
  Filled 2020-08-01 (×4): qty 1

## 2020-08-01 MED ORDER — PROPOFOL 10 MG/ML IV BOLUS
INTRAVENOUS | Status: DC | PRN
  Administered 2020-08-01: 20 mg via INTRAVENOUS
  Administered 2020-08-01: 30 mg via INTRAVENOUS

## 2020-08-01 MED ORDER — DIPHENHYDRAMINE HCL 50 MG/ML IJ SOLN
INTRAMUSCULAR | Status: AC
  Filled 2020-08-01: qty 1

## 2020-08-01 MED ORDER — OXYCODONE HCL 5 MG PO TABS: 5.0000 mg | ORAL_TABLET | Freq: Once | ORAL | Status: DC | PRN

## 2020-08-01 MED ORDER — MORPHINE SULFATE (PF) 10 MG/ML IV SOLN
INTRAVENOUS | Status: DC | PRN
  Administered 2020-08-01: 10 mg

## 2020-08-01 MED ORDER — MIDAZOLAM HCL 5 MG/5ML IJ SOLN
INTRAMUSCULAR | Status: DC | PRN
  Administered 2020-08-01: 2 mg via INTRAVENOUS

## 2020-08-01 MED ORDER — ONDANSETRON HCL 4 MG/2ML IJ SOLN: 4.0000 mg | Freq: Four times a day (QID) | INTRAMUSCULAR | Status: DC | PRN

## 2020-08-01 MED ORDER — ONDANSETRON HCL 4 MG/2ML IJ SOLN: 4.0000 mg | Freq: Once | INTRAMUSCULAR | Status: DC | PRN

## 2020-08-01 MED ORDER — OXYCODONE HCL 5 MG/5ML PO SOLN: 5.0000 mg | Freq: Once | ORAL | Status: DC | PRN

## 2020-08-01 MED ORDER — HYDROMORPHONE HCL 1 MG/ML IJ SOLN
0.5000 mg | INTRAMUSCULAR | Status: DC | PRN
  Administered 2020-08-01 – 2020-08-03 (×5): 1 mg via INTRAVENOUS
  Filled 2020-08-01 (×5): qty 1

## 2020-08-01 MED ORDER — ACETAMINOPHEN 500 MG PO TABS
1000.0000 mg | ORAL_TABLET | Freq: Four times a day (QID) | ORAL | Status: AC
  Administered 2020-08-01 – 2020-08-02 (×4): 1000 mg via ORAL
  Filled 2020-08-01 (×4): qty 2

## 2020-08-01 MED ORDER — LIDOCAINE HCL (CARDIAC) PF 100 MG/5ML IV SOSY
PREFILLED_SYRINGE | INTRAVENOUS | Status: DC | PRN
  Administered 2020-08-01: 100 mg via INTRAVENOUS

## 2020-08-01 MED ORDER — BISACODYL 10 MG RE SUPP: 10.0000 mg | Freq: Every day | RECTAL | Status: DC | PRN

## 2020-08-01 MED ORDER — METOCLOPRAMIDE HCL 5 MG/ML IJ SOLN
5.0000 mg | Freq: Three times a day (TID) | INTRAMUSCULAR | Status: DC | PRN
Start: 2020-08-01 — End: 2020-08-03

## 2020-08-01 MED ORDER — ACETAMINOPHEN 10 MG/ML IV SOLN: 1000.0000 mg | Freq: Once | INTRAVENOUS | Status: DC | PRN

## 2020-08-01 MED ORDER — LIRAGLUTIDE -WEIGHT MANAGEMENT 18 MG/3ML ~~LOC~~ SOPN
3.0000 mg | PEN_INJECTOR | Freq: Every day | SUBCUTANEOUS | Status: DC
  Administered 2020-08-02 – 2020-08-03 (×2): 3 mg via SUBCUTANEOUS

## 2020-08-01 MED ORDER — DOCUSATE SODIUM 100 MG PO CAPS
100.0000 mg | ORAL_CAPSULE | Freq: Two times a day (BID) | ORAL | Status: DC
  Administered 2020-08-01 – 2020-08-03 (×5): 100 mg via ORAL
  Filled 2020-08-01 (×5): qty 1

## 2020-08-01 MED ORDER — FENTANYL CITRATE (PF) 100 MCG/2ML IJ SOLN
INTRAMUSCULAR | Status: AC
  Filled 2020-08-01: qty 2

## 2020-08-01 MED ORDER — COLCHICINE 0.6 MG PO TABS: 0.6000 mg | ORAL_TABLET | Freq: Every day | ORAL | Status: DC | PRN

## 2020-08-01 MED ORDER — ACETAMINOPHEN 325 MG PO TABS
325.0000 mg | ORAL_TABLET | Freq: Four times a day (QID) | ORAL | Status: DC | PRN
  Administered 2020-08-03: 325 mg via ORAL
  Filled 2020-08-01: qty 2

## 2020-08-01 MED ORDER — ZOLPIDEM TARTRATE 5 MG PO TABS: 5.0000 mg | ORAL_TABLET | Freq: Every evening | ORAL | Status: DC | PRN

## 2020-08-01 MED ORDER — FENTANYL CITRATE (PF) 100 MCG/2ML IJ SOLN
INTRAMUSCULAR | Status: DC | PRN
  Administered 2020-08-01 (×4): 25 ug via INTRAVENOUS

## 2020-08-01 MED ORDER — BUPIVACAINE HCL (PF) 0.5 % IJ SOLN
INTRAMUSCULAR | Status: AC
  Filled 2020-08-01: qty 10

## 2020-08-01 MED ORDER — ONDANSETRON HCL 4 MG/2ML IJ SOLN
INTRAMUSCULAR | Status: DC | PRN
  Administered 2020-08-01: 4 mg via INTRAVENOUS

## 2020-08-01 MED ORDER — ACETAMINOPHEN 10 MG/ML IV SOLN
INTRAVENOUS | Status: AC
  Filled 2020-08-01: qty 100

## 2020-08-01 MED ORDER — SODIUM CHLORIDE 0.9 % IV SOLN
INTRAVENOUS | Status: DC | PRN
  Administered 2020-08-01: 60 mL

## 2020-08-01 MED ORDER — FAMOTIDINE 20 MG PO TABS
ORAL_TABLET | ORAL | Status: AC
  Administered 2020-08-01: 20 mg via ORAL
  Filled 2020-08-01: qty 1

## 2020-08-01 SURGICAL SUPPLY — 72 items
BLADE SAGITTAL 25.0X1.19X90 (BLADE) ×2 IMPLANT
BLADE SAW 90X13X1.19 OSCILLAT (BLADE) ×2 IMPLANT
BLOCK CUTTING FEMUR 2+ LT MED (MISCELLANEOUS) ×2 IMPLANT
BLOCK CUTTING TIBIAL 3 LT (MISCELLANEOUS) ×2 IMPLANT
BNDG ELASTIC 6X5.8 VLCR STR LF (GAUZE/BANDAGES/DRESSINGS) ×2 IMPLANT
CANISTER SUCT 1200ML W/VALVE (MISCELLANEOUS) ×2 IMPLANT
CANISTER SUCT 3000ML PPV (MISCELLANEOUS) ×4 IMPLANT
CANISTER WOUND CARE 500ML ATS (WOUND CARE) ×2 IMPLANT
CEMENT HV SMART SET (Cement) ×4 IMPLANT
CHLORAPREP W/TINT 26 (MISCELLANEOUS) ×4 IMPLANT
COOLER POLAR GLACIER W/PUMP (MISCELLANEOUS) ×2 IMPLANT
COVER WAND RF STERILE (DRAPES) ×2 IMPLANT
CUFF TOURN SGL QUICK 24 (TOURNIQUET CUFF)
CUFF TOURN SGL QUICK 30 (TOURNIQUET CUFF)
CUFF TRNQT CYL 24X4X16.5-23 (TOURNIQUET CUFF) IMPLANT
CUFF TRNQT CYL 30X4X21-28X (TOURNIQUET CUFF) IMPLANT
DRAPE 3/4 80X56 (DRAPES) ×4 IMPLANT
DRSG MEPILEX SACRM 8.7X9.8 (GAUZE/BANDAGES/DRESSINGS) ×2 IMPLANT
ELECT CAUTERY BLADE 6.4 (BLADE) ×2 IMPLANT
ELECT REM PT RETURN 9FT ADLT (ELECTROSURGICAL) ×2
ELECTRODE REM PT RTRN 9FT ADLT (ELECTROSURGICAL) ×1 IMPLANT
FEMERAL COMPONENT LEFT SZ2P (Femur) ×2 IMPLANT
FEMUR BONE MODEL 4.9010 MEDACT (MISCELLANEOUS) ×4 IMPLANT
GAUZE SPONGE 4X4 12PLY STRL (GAUZE/BANDAGES/DRESSINGS) ×2 IMPLANT
GAUZE XEROFORM 1X8 LF (GAUZE/BANDAGES/DRESSINGS) ×2 IMPLANT
GLOVE SURG ORTHO LTX SZ8 (GLOVE) ×2 IMPLANT
GLOVE SURG SYN 9.0  PF PI (GLOVE) ×1
GLOVE SURG SYN 9.0 PF PI (GLOVE) ×1 IMPLANT
GLOVE SURG UNDER LTX SZ8 (GLOVE) ×2 IMPLANT
GLOVE SURG UNDER POLY LF SZ9 (GLOVE) ×2 IMPLANT
GOWN SRG 2XL LVL 4 RGLN SLV (GOWNS) ×1 IMPLANT
GOWN STRL NON-REIN 2XL LVL4 (GOWNS) ×1
GOWN STRL REUS W/ TWL LRG LVL3 (GOWN DISPOSABLE) ×1 IMPLANT
GOWN STRL REUS W/ TWL XL LVL3 (GOWN DISPOSABLE) ×1 IMPLANT
GOWN STRL REUS W/TWL LRG LVL3 (GOWN DISPOSABLE) ×1
GOWN STRL REUS W/TWL XL LVL3 (GOWN DISPOSABLE) ×1
HOLDER FOLEY CATH W/STRAP (MISCELLANEOUS) ×2 IMPLANT
HOOD PEEL AWAY FLYTE STAYCOOL (MISCELLANEOUS) ×4 IMPLANT
INSERT TIBIAL SZ3 LT 14 FLEX (Insert) ×2 IMPLANT
IRRIGATION SURGIPHOR STRL (IV SOLUTION) IMPLANT
IV NS IRRIG 3000ML ARTHROMATIC (IV SOLUTION) ×2 IMPLANT
KIT PREVENA INCISION MGT20CM45 (CANNISTER) ×2 IMPLANT
KIT TURNOVER KIT A (KITS) ×2 IMPLANT
MANIFOLD NEPTUNE II (INSTRUMENTS) ×2 IMPLANT
NDL SAFETY ECLIPSE 18X1.5 (NEEDLE) ×1 IMPLANT
NEEDLE HYPO 18GX1.5 SHARP (NEEDLE) ×1
NEEDLE SPNL 18GX3.5 QUINCKE PK (NEEDLE) ×2 IMPLANT
NEEDLE SPNL 20GX3.5 QUINCKE YW (NEEDLE) ×2 IMPLANT
NS IRRIG 1000ML POUR BTL (IV SOLUTION) ×2 IMPLANT
PACK TOTAL KNEE (MISCELLANEOUS) ×2 IMPLANT
PAD WRAPON POLAR KNEE (MISCELLANEOUS) ×1 IMPLANT
PATELLA RESURFACING MEDACTA 02 (Bone Implant) ×2 IMPLANT
PENCIL SMOKE EVACUATOR COATED (MISCELLANEOUS) ×2 IMPLANT
PULSAVAC PLUS IRRIG FAN TIP (DISPOSABLE) ×2
SCALPEL PROTECTED #10 DISP (BLADE) ×4 IMPLANT
STAPLER SKIN PROX 35W (STAPLE) ×2 IMPLANT
STEM EXTENSION 11MMX30MM (Stem) ×2 IMPLANT
SUCTION FRAZIER HANDLE 10FR (MISCELLANEOUS) ×1
SUCTION TUBE FRAZIER 10FR DISP (MISCELLANEOUS) ×1 IMPLANT
SUT DVC 2 QUILL PDO  T11 36X36 (SUTURE) ×1
SUT DVC 2 QUILL PDO T11 36X36 (SUTURE) ×1 IMPLANT
SUT ETHIBOND 2 V 37 (SUTURE) IMPLANT
SUT V-LOC 90 ABS DVC 3-0 CL (SUTURE) ×2 IMPLANT
SYR 20ML LL LF (SYRINGE) ×2 IMPLANT
SYR 50ML LL SCALE MARK (SYRINGE) ×4 IMPLANT
TIBIAL BONE MODEL LEFT (MISCELLANEOUS) ×2 IMPLANT
TIBIAL TRAY FIXED MEDACTA 0207 (Bone Implant) ×2 IMPLANT
TIP FAN IRRIG PULSAVAC PLUS (DISPOSABLE) ×1 IMPLANT
TOWEL OR 17X26 4PK STRL BLUE (TOWEL DISPOSABLE) ×2 IMPLANT
TOWER CARTRIDGE SMART MIX (DISPOSABLE) ×2 IMPLANT
TRAY FOLEY MTR SLVR 16FR STAT (SET/KITS/TRAYS/PACK) ×2 IMPLANT
WRAPON POLAR PAD KNEE (MISCELLANEOUS) ×2

## 2020-08-01 NOTE — Op Note (Signed)
08/01/2020  11:04 AM  PATIENT:  Sara Hebert   MRN: [id number]  PRE-OPERATIVE DIAGNOSIS:  Primary localized osteoarthritis of left knee   POST-OPERATIVE DIAGNOSIS:  Same   PROCEDURE:  Procedure(s): Left TOTAL KNEE ARTHROPLASTY   SURGEON: Leitha Schuller, MD   ASSISTANTS: Cranston Neighbor, PA-C   ANESTHESIA:   spinal   EBL:   100 cc   BLOOD ADMINISTERED:none   DRAINS: Incisional wound VAC    LOCAL MEDICATIONS USED:  MARCAINE    and OTHER Exparel and morphine   SPECIMEN:  No Specimen   DISPOSITION OF SPECIMEN:  N/A   COUNTS:  YES   TOURNIQUET:   60 minutes at 300 mm Hg   IMPLANTS: Medacta  GMK sphere system with  2 left plus femur, 3 left tibia with short stem and  14 mm insert.  Size  2 patella, all components cemented.   DICTATION: Reubin Milan Dictation   patient was brought to the operating room and spinal anesthesia was obtained.  After prepping and draping the  left leg in sterile fashion, and after patient identification and timeout procedures were completed, tourniquet was raised  and midline skin incision was made followed by medial parapatellar arthrotomy with  severe medial compartment osteoarthritis, severe patellofemoral arthritis and  mild lateral compartment arthritis, partial synovectomy was also carried out.   The ACL and PCL and fat pad were excised along with anterior horns of the meniscus. The proximal tibia cutting guide from  the [facility] system was applied and the proximal tibia cut carried out.  The distal femoral cut was carried out in a similar fashion     The  2+ femoral cutting guide applied with anterior posterior and chamfer cuts made.  The posterior horns of the menisci were removed at this point.   Injection of the above medication was carried out after the femoral and tibial cuts were carried out.  The  3 baseplate trial was placed pinned into position and proximal tibial preparation carried out with drilling hand reaming and the keel punch followed  by placement of the  2+ femur and sizing the tibial insert size   14 millimeter gave the best fit with stability and full extension.  The distal femoral drill holes were made in the notch cut for the trochlear groove was then carried out with trials were then removed the patella was cut using the patellar cutting guide and it sized to a size  2 after drill holes have been made  The knee was irrigated with pulsatile lavage and the bony surfaces dried the tibial component was cemented into place first.  Excess cement was removed and the polyethylene insert placed with a torque screw placed with a torque screwdriver tightened.  The distal femoral component was placed and the knee was held in extension as the patellar button was clamped into place.  After the cement was set, excess cement was removed and the knee was again irrigated thoroughly thoroughly irrigated.  The tourniquet was let down and hemostasis checked with electrocautery. The arthrotomy was repaired with a heavy Quill suture,  followed by 3-0 V lock subcuticular closure, skin staples followed by incisional wound VAC and Polar Care.Marland Kitchen   PLAN OF CARE: Admit to inpatient    PATIENT DISPOSITION:  PACU - hemodynamically stable.

## 2020-08-01 NOTE — H&P (Signed)
Chief Complaint  Patient presents with  . Pre-op Exam  scheduled for Left TKA 08/01/20 with Dr. Lynda Rainwater Sara Hebert is a 60 y.o. female who presents today for history and physical for left total knee arthroplasty with Dr. Kennedy Bucker on 08/01/2020. Patient has x-ray showing near complete loss of joint space in the medial compartment of the left knee. Pain has been progressively getting worse over the last couple of years. She has had cortisone injections as well as gel injections with no relief. She describes a constant aching pain and giving away of the left knee. Pain resembles her previous pain that she experienced in her right knee before she underwent successful right total knee arthroplasty. Right knee is doing very well and she is pleased. Patient's pain interferes with her quality of life and activities daily living.  Past Medical History: Past Medical History:  Diagnosis Date  . Allergy  pt states she is unsure why on chart  . Chronic kidney disease  . Depression  . Hypertension  . Thyroid disease   Past Surgical History: Past Surgical History:  Procedure Laterality Date  . CHOLECYSTECTOMY 2018  . HYSTERECTOMY 1994  . REPLACEMENT TOTAL KNEE Right 08/03/2019  Dr. Rosita Kea   Past Family History: Family History  Problem Relation Age of Onset  . Kidney disease Mother  dialysis 19 yrs  . High blood pressure (Hypertension) Mother  . Breast cancer Mother  . High blood pressure (Hypertension) Father  . Other Father  enlarged heart  . High blood pressure (Hypertension) Sister   Medications: Current Outpatient Medications Ordered in Epic  Medication Sig Dispense Refill  . acetaminophen/diphenhydramine (TYLENOL PM ORAL) Take by mouth nightly  . buPROPion (WELLBUTRIN XL) 300 MG XL tablet TAKE 1 TABLET BY MOUTH ONCE DAILY FOR MOOD 90 tablet 1  . citalopram (CELEXA) 40 MG tablet TAKE 1 TABLET BY MOUTH ONCE DAILY FOR DEPRESSION 90 tablet 1  . colchicine (COLCRYS) 0.6 mg tablet  Take 1 tablet (0.6 mg total) by mouth 2 (two) times daily Take 1 tablet twice a day for 1 week and then 1 tablet once a day 30 tablet 1  . docusate (COLACE) 100 MG capsule Take 1 capsule by mouth 2 (two) times daily  . ergocalciferol, vitamin D2, 1,250 mcg (50,000 unit) capsule Take 1 capsule (50,000 Units total) by mouth once a week for 30 days 12 capsule 1  . levothyroxine (SYNTHROID) 150 MCG tablet Take 1 tablet (150 mcg total) by mouth once daily 90 tablet 1  . liraglutide, weight loss, (SAXENDA) 3 mg/0.5 mL (18 mg/3 mL) pen injector Inject subcutaneously  . lisinopriL (ZESTRIL) 10 MG tablet Take 1 tablet (10 mg total) by mouth once daily 90 tablet 0  . UNIFINE PENTIPS 31 gauge x 3/16" needle USE AS DIRECTED WITH SAXENDA 100 each 1   No current Epic-ordered facility-administered medications on file.   Allergies: No Known Allergies   Review of Systems:  A comprehensive 14 point ROS was performed, reviewed by me today, and the pertinent orthopaedic findings are documented in the HPI.  Exam: Ht 165.1 cm (5\' 5" )  Wt 98.9 kg (218 lb)  LMP (LMP Unknown)  BMI 36.28 kg/m  General:  Well developed, well nourished, no apparent distress, normal affect, minimal antalgic gait. No assistive devices  HEENT: Head normocephalic, atraumatic, PERRL.   Abdomen: Soft, non tender, non distended, Bowel sounds present.  Heart: Examination of the heart reveals regular, rate, and rhythm. There is no murmur noted on  ascultation. There is a normal apical pulse.  Lungs: Lungs are clear to auscultation. There is no wheeze, rhonchi, or crackles. There is normal expansion of bilateral chest walls.   Left lower extremity: Examination of the left knee shows mild swelling and tenderness on the medial joint line. No warmth erythema or drainage. Laxity with varus stress testing. Normal hip internal X rotation with no discomfort. She has 0 to 115 degrees range of motion. She is able to straight leg raise. No  swelling or edema throughout the lower leg. Good range of motion of the hip with no discomfort.  AP lateral sunrise views and mid PA flexion views of the left knee are reviewed by me in the office today. Impression: Patient has severe joint space narrowing in the medial compartment that is increased with the mid PA flexion view showing near bone-on-bone in the medial compartment. She has mild patellofemoral arthritic changes with mild sclerotic changes in the lateral compartment. No evidence of acute bony abnormality  Impression: Primary osteoarthritis of left knee [M17.12] Primary osteoarthritis of left knee (primary encounter diagnosis)  Plan:  47. 60 year old female with severe left knee pain along the medial compartment. X-rays and MRI shows severe degenerative changes in the medial compartment with some mild degenerative changes in the lateral and patellofemoral compartments. Risks, benefits, complications of left total knee arthroplasty have been discussed with the patient. Patient has agreed and consented procedure with Dr. Kennedy Bucker on 08/01/2020.  This note was generated in part with voice recognition software and I apologize for any typographical errors that were not detected and corrected.  Patience Musca MPA-C    Electronically signed by Patience Musca, PA at 07/24/2020 9:55 AM E  Reviewed  H+P. No changes noted.

## 2020-08-01 NOTE — Anesthesia Preprocedure Evaluation (Signed)
Anesthesia Evaluation  Patient identified by MRN, date of birth, ID band Patient awake    Reviewed: Allergy & Precautions, H&P , NPO status , Patient's Chart, lab work & pertinent test results  Airway Mallampati: II  TM Distance: >3 FB Neck ROM: full    Dental  (+) Teeth Intact   Pulmonary neg pulmonary ROS,    breath sounds clear to auscultation       Cardiovascular hypertension,  Rhythm:Regular Rate:Normal     Neuro/Psych PSYCHIATRIC DISORDERS Anxiety Depression negative neurological ROS     GI/Hepatic negative GI ROS, Neg liver ROS,   Endo/Other  Hypothyroidism Morbid obesity  Renal/GU CRFRenal disease     Musculoskeletal   Abdominal   Peds  Hematology negative hematology ROS (+)   Anesthesia Other Findings Past Medical History: No date: Anxiety No date: Arthritis No date: Chronic kidney disease     Comment:  early stages. creatnine recently was normal No date: Depression No date: Hypertension No date: Hypothyroidism  Past Surgical History: No date: ABDOMINAL HYSTERECTOMY No date: CHOLECYSTECTOMY  BMI    Body Mass Index: 44.27 kg/m      Reproductive/Obstetrics negative OB ROS                             Anesthesia Physical  Anesthesia Plan  ASA: II  Anesthesia Plan: Spinal   Post-op Pain Management:    Induction: Intravenous  PONV Risk Score and Plan: 2 and Ondansetron, Dexamethasone, Propofol infusion, TIVA and Midazolam  Airway Management Planned: Natural Airway and Nasal Cannula  Additional Equipment: None  Intra-op Plan:   Post-operative Plan:   Informed Consent: I have reviewed the patients History and Physical, chart, labs and discussed the procedure including the risks, benefits and alternatives for the proposed anesthesia with the patient or authorized representative who has indicated his/her understanding and acceptance.     Dental Advisory  Given  Plan Discussed with: Anesthesiologist  Anesthesia Plan Comments: (Discussed R/B/A of neuraxial anesthesia technique with patient: - rare risks of spinal/epidural hematoma, nerve damage, infection - Risk of PDPH - Risk of nausea and vomiting - Risk of conversion to general anesthesia and its associated risks, including sore throat, damage to lips/teeth/oropharynx, and rare risks such as cardiac and respiratory events.  Patient voiced understanding.)        Anesthesia Quick Evaluation

## 2020-08-01 NOTE — Progress Notes (Signed)
Pt admitted to room 150 and oriented to equipment. Pt reports no pain at this time and is ordering lunch. VSS and are listed below.    08/01/20 1247  Vitals  Temp 97.9 F (36.6 C)  BP 114/66  MAP (mmHg) 80  BP Location Left Arm  BP Method Automatic  Patient Position (if appropriate) Lying  Pulse Rate 79  Pulse Rate Source Monitor  Resp 17  MEWS COLOR  MEWS Score Color Green  Oxygen Therapy  SpO2 100 %  O2 Device Room Air

## 2020-08-01 NOTE — Anesthesia Postprocedure Evaluation (Signed)
Anesthesia Post Note  Patient: Sara Hebert  Procedure(s) Performed: TOTAL KNEE ARTHROPLASTY - [place] Neighbor to Assist (Left Knee)  Patient location during evaluation: PACU Anesthesia Type: Spinal Level of consciousness: oriented and awake and alert Pain management: pain level controlled Vital Signs Assessment: post-procedure vital signs reviewed and stable Respiratory status: spontaneous breathing, respiratory function stable and patient connected to nasal cannula oxygen Cardiovascular status: blood pressure returned to baseline and stable Postop Assessment: no headache, no backache and no apparent nausea or vomiting Anesthetic complications: no   No complications documented.   Last Vitals:  Vitals:   08/01/20 1247 08/01/20 1446  BP: 114/66 115/69  Pulse: 79 81  Resp: 17 17  Temp: 36.6 C 36.6 C  SpO2: 100% 100%    Last Pain:  Vitals:   08/01/20 1200  TempSrc:   PainSc: 0-No pain                 Corinda Gubler

## 2020-08-01 NOTE — Transfer of Care (Signed)
Immediate Anesthesia Transfer of Care Note  Patient: Willette Cluster  Procedure(s) Performed: TOTAL KNEE ARTHROPLASTY - [place] Neighbor to Assist (Left Knee)  Patient Location: PACU  Anesthesia Type:General and Spinal  Level of Consciousness: drowsy  Airway & Oxygen Therapy: Patient Spontanous Breathing and Patient connected to face mask oxygen  Post-op Assessment: Report given to RN and Post -op Vital signs reviewed and stable  Post vital signs: Reviewed and stable  Last Vitals:  Vitals Value Taken Time  BP 105/63 08/01/20 1106  Temp    Pulse 89 08/01/20 1110  Resp 22 08/01/20 1110  SpO2 100 % 08/01/20 1110  Vitals shown include unvalidated device data.  Last Pain:  Vitals:   08/01/20 0726  TempSrc: Tympanic  PainSc: 0-No pain      Patients Stated Pain Goal: 0 (08/01/20 0726)  Complications: No complications documented.

## 2020-08-01 NOTE — Evaluation (Signed)
Physical Therapy Evaluation Patient Details Name: Sara Hebert MRN: [id number] DOB: 1961/04/15 Today's Date: 08/01/2020   History of Present Illness  Sara Hebert is a 60 y.o. female who presents today for history and physical for left total knee arthroplasty with Dr. Kennedy Bucker on 08/01/2020. Had R LE TKA 1 year ago.  Clinical Impression  Patient agrees to PT session. She is motivated to get moving, feeling good. Patient is mod independent with bed mobility. Transfers with min guard. Ambulated 60 feet with RW and min guard. Performed L LE exercises in bed at end of session. Patient will continue to benefit from skilled PT to work on ambulation, strength and safety.     Follow Up Recommendations Home health PT;Supervision - Intermittent    Equipment Recommendations  None recommended by PT    Recommendations for Other Services       Precautions / Restrictions Precautions Precautions: Fall;Knee Restrictions Weight Bearing Restrictions: No Other Position/Activity Restrictions: WBAT      Mobility  Bed Mobility Overal bed mobility: Modified Independent                  Transfers Overall transfer level: Modified independent Equipment used: Rolling walker (2 wheeled)                Ambulation/Gait Ambulation/Gait assistance: Min guard Gait Distance (Feet): 60 Feet Assistive device: Rolling walker (2 wheeled) Gait Pattern/deviations: Step-to pattern;Decreased stance time - left;Decreased stride length Gait velocity: WFL   General Gait Details: Patient demonstrates good balance and safety awareness.  Stairs            Wheelchair Mobility    Modified Rankin (Stroke Patients Only)       Balance Overall balance assessment: Modified Independent                                           Pertinent Vitals/Pain Pain Assessment: 0-10 Pain Score: 3  Pain Descriptors / Indicators: Sore Pain Intervention(s): Premedicated  before session;Monitored during session    Home Living Family/patient expects to be discharged to:: Private residence Living Arrangements: Other relatives Available Help at Discharge: Family;Available PRN/intermittently Type of Home: House Home Access: Stairs to enter   [employer] of Steps: 3 Home Layout: Able to live on main level with bedroom/bathroom Home Equipment: Dan Humphreys - 2 wheels      Prior Function Level of Independence: Independent               Hand Dominance        Extremity/Trunk Assessment   Upper Extremity Assessment Upper Extremity Assessment: Overall WFL for tasks assessed    Lower Extremity Assessment Lower Extremity Assessment: LLE deficits/detail RLE Sensation: WNL RLE Coordination: WNL       Communication   Communication: No difficulties  Cognition Arousal/Alertness: Awake/alert Behavior During Therapy: WFL for tasks assessed/performed Overall Cognitive Status: Within Functional Limits for tasks assessed                                        General Comments      Exercises Total Joint Exercises Ankle Circles/Pumps: AROM;10 reps Quad Sets: AROM;10 reps;Left Heel Slides: AROM;Left;10 reps Hip ABduction/ADduction: AROM;Left;10 reps Straight Leg Raises: AROM;Left;10 reps   Assessment/Plan    PT Assessment Patient needs continued PT  services  PT Problem List Decreased strength;Decreased mobility;Decreased balance;Decreased activity tolerance;Pain       PT Treatment Interventions DME instruction;Therapeutic exercise;Gait training;Balance training;Functional mobility training;Therapeutic activities;Patient/family education;Stair training    PT Goals (Current goals can be found in the Care Plan section)  Acute Rehab PT Goals Patient Stated Goal: to return to sister's house PT Goal Formulation: With patient/family Time For Goal Achievement: 08/08/20 Potential to Achieve Goals: Good    Frequency BID    Barriers to discharge        Co-evaluation               AM-PAC PT "6 Clicks" Mobility  Outcome Measure Help needed turning from your back to your side while in a flat bed without using bedrails?: A Little Help needed moving from lying on your back to sitting on the side of a flat bed without using bedrails?: A Little Help needed moving to and from a bed to a chair (including a wheelchair)?: A Little Help needed standing up from a chair using your arms (e.g., wheelchair or bedside chair)?: A Little Help needed to walk in hospital room?: A Little Help needed climbing 3-5 steps with a railing? : A Little 6 Click Score: 18    End of Session Equipment Utilized During Treatment: Gait belt Activity Tolerance: Patient tolerated treatment well Patient left: in bed;with call bell/phone within reach;with SCD's reapplied Nurse Communication: Mobility status PT Visit Diagnosis: Other abnormalities of gait and mobility (R26.89);Difficulty in walking, not elsewhere classified (R26.2);Muscle weakness (generalized) (M62.81);Pain Pain - Right/Left: Left Pain - part of body: Knee    Time: 1500-1525 PT Time Calculation (min) (ACUTE ONLY): 25 min   Charges:   PT Evaluation $PT Eval Moderate Complexity: 1 Mod PT Treatments $Gait Training: 8-22 mins        Sara Hebert, PT, GCS 08/01/20,4:29 PM

## 2020-08-01 NOTE — Anesthesia Procedure Notes (Addendum)
Spinal  Patient location during procedure: OR Start time: 08/01/2020 9:00 AM End time: 08/01/2020 9:07 AM Reason for block: surgical anesthesia Staffing Performed: other anesthesia staff  Anesthesiologist: Corinda Gubler, MD Resident/CRNA: Jeanine Luz, CRNA Other anesthesia staff: Katina Degree, RN Preanesthetic Checklist Completed: patient identified, IV checked, site marked, risks and benefits discussed, surgical consent, monitors and equipment checked, pre-op evaluation and timeout performed Spinal Block Patient position: sitting Prep: ChloraPrep Patient monitoring: heart rate, continuous pulse ox and blood pressure Approach: midline Location: L3-4 Injection technique: single-shot Needle Needle type: Quincke  Needle gauge: 22 G Needle length: 9 cm Assessment Events: CSF return

## 2020-08-02 LAB — BASIC METABOLIC PANEL
Anion gap: 6 (ref 5–15)
BUN: 20 mg/dL (ref 6–20)
CO2: 25 mmol/L (ref 22–32)
Calcium: 8.9 mg/dL (ref 8.9–10.3)
Chloride: 105 mmol/L (ref 98–111)
Creatinine, Ser: 1.74 mg/dL — ABNORMAL HIGH (ref 0.44–1.00)
GFR, Estimated: 34 mL/min — ABNORMAL LOW (ref >60)
Glucose, Bld: 98 mg/dL (ref 70–99)
Potassium: 4.4 mmol/L (ref 3.5–5.1)
Sodium: 136 mmol/L (ref 135–145)

## 2020-08-02 LAB — CBC
HCT: 35 % — ABNORMAL LOW (ref 36.0–46.0)
Hemoglobin: 11.4 g/dL — ABNORMAL LOW (ref 12.0–15.0)
MCH: 30.9 pg (ref 26.0–34.0)
MCHC: 32.6 g/dL (ref 30.0–36.0)
MCV: 94.9 fL (ref 80.0–100.0)
Platelets: 209 10*3/uL (ref 150–400)
RBC: 3.69 MIL/uL — ABNORMAL LOW (ref 3.87–5.11)
RDW: 13.2 % (ref 11.5–15.5)
WBC: 7.9 10*3/uL (ref 4.0–10.5)
nRBC: 0 % (ref 0.0–0.2)

## 2020-08-02 MED ORDER — HYDROMORPHONE HCL 2 MG PO TABS
2.0000 mg | ORAL_TABLET | ORAL | Status: DC | PRN
  Administered 2020-08-02 – 2020-08-03 (×4): 2 mg via ORAL
  Filled 2020-08-02 (×4): qty 1

## 2020-08-02 MED ORDER — ACETAMINOPHEN 325 MG PO TABS: 325.0000 mg | ORAL_TABLET | Freq: Four times a day (QID) | ORAL | 0 refills | Status: AC | PRN

## 2020-08-02 MED ORDER — SENNA-DOCUSATE SODIUM 8.6-50 MG PO TABS: 2.0000 | ORAL_TABLET | Freq: Every day | ORAL | 1 refills | Status: DC

## 2020-08-02 MED ORDER — HYDROMORPHONE HCL 2 MG PO TABS
2.0000 mg | ORAL_TABLET | ORAL | 0 refills | Status: DC | PRN
Start: 2020-08-02 — End: 2020-09-09

## 2020-08-02 MED ORDER — METHOCARBAMOL 500 MG PO TABS: 500.0000 mg | ORAL_TABLET | Freq: Four times a day (QID) | ORAL | 0 refills | Status: DC | PRN

## 2020-08-02 NOTE — Progress Notes (Signed)
Physical Therapy Treatment Patient Details Name: Sara Hebert MRN: [id number] DOB: 23-Jan-1961 Today's Date: 08/02/2020    History of Present Illness Goddess Vent is a 60 y.o. female who presents today for history and physical for left total knee arthroplasty with Dr. Kennedy Bucker on 08/01/2020. Had R LE TKA 1 year ago.    PT Comments    Patient received in bed, reports she needs to go to the bathroom. Assisted patient to pivot to [facility] prior to ambulation. Patient mod independent with bed mobility, transfers with supervision. Patient ambulated 170 feet with RW and min guard. Patient will continue to benefit from skilled PT while here to improve strength, ROM , and independence. She will need to do steps tomorrow prior to discharge.     Follow Up Recommendations  Home health PT;Supervision - Intermittent     Equipment Recommendations  None recommended by PT    Recommendations for Other Services       Precautions / Restrictions Precautions Precautions: Fall;Knee Restrictions Weight Bearing Restrictions: No Other Position/Activity Restrictions: WBAT    Mobility  Bed Mobility Overal bed mobility: Needs Assistance Bed Mobility: Supine to Sit;Sit to Supine     Supine to sit: Modified independent (Device/Increase time) Sit to supine: Min guard   General bed mobility comments: min guard to bring LE back up onto bed due to pain this pm.    Transfers Overall transfer level: Needs assistance Equipment used: Rolling walker (2 wheeled) Transfers: Sit to/from [employer] Sit to Stand: Supervision Stand pivot transfers: Supervision          Ambulation/Gait Ambulation/Gait assistance: Min guard Gait Distance (Feet): 170 Feet Assistive device: Rolling walker (2 wheeled) Gait Pattern/deviations: Decreased stance time - left;Decreased weight shift to left;Step-through pattern Gait velocity: decreased slightly   General Gait Details: Patient  demonstrates good balance and safety awareness.   Stairs             Wheelchair Mobility    Modified Rankin (Stroke Patients Only)       Balance Overall balance assessment: Modified Independent                                          Cognition Arousal/Alertness: Awake/alert Behavior During Therapy: WFL for tasks assessed/performed Overall Cognitive Status: Within Functional Limits for tasks assessed                                        Exercises Total Joint Exercises Ankle Circles/Pumps: AROM;Left;10 reps Quad Sets: AROM;10 reps;Left Heel Slides: AROM;Seated;Left;5 reps Hip ABduction/ADduction: AAROM;10 reps;Left Straight Leg Raises: AAROM;Left;10 reps Long Arc Quad: AROM;10 reps;Left Goniometric ROM: 0-90    General Comments        Pertinent Vitals/Pain Pain Assessment: 0-10 Pain Score: 8  Faces Pain Scale: Hurts little more Pain Descriptors / Indicators: Discomfort;Sore;Guarding Pain Intervention(s): Monitored during session;Patient requesting pain meds-RN notified;Ice applied;Repositioned    Home Living                      Prior Function            PT Goals (current goals can now be found in the care plan section) Acute Rehab PT Goals Patient Stated Goal: to return to sister's house PT Goal Formulation: With patient Time  For Goal Achievement: 08/08/20 Potential to Achieve Goals: Good Progress towards PT goals: Progressing toward goals    Frequency    BID      PT Plan Current plan remains appropriate    Co-evaluation              AM-PAC PT "6 Clicks" Mobility   Outcome Measure  Help needed turning from your back to your side while in a flat bed without using bedrails?: A Little Help needed moving from lying on your back to sitting on the side of a flat bed without using bedrails?: A Little Help needed moving to and from a bed to a chair (including a wheelchair)?: A Little Help  needed standing up from a chair using your arms (e.g., wheelchair or bedside chair)?: A Little Help needed to walk in hospital room?: A Little Help needed climbing 3-5 steps with a railing? : A Little 6 Click Score: 18    End of Session Equipment Utilized During Treatment: Gait belt Activity Tolerance: Patient tolerated treatment well Patient left: in bed;with call bell/phone within reach;with bed alarm set Nurse Communication: Mobility status;Patient requests pain meds PT Visit Diagnosis: Other abnormalities of gait and mobility (R26.89);Difficulty in walking, not elsewhere classified (R26.2);Muscle weakness (generalized) (M62.81);Pain Pain - Right/Left: Left Pain - part of body: Knee     Time: 1400-1430 PT Time Calculation (min) (ACUTE ONLY): 30 min  Charges:  $Gait Training: 8-22 mins $Therapeutic Exercise: 8-22 mins                     Debralee Braaksma, PT, GCS 08/02/20,2:53 PM

## 2020-08-02 NOTE — Discharge Instructions (Signed)

## 2020-08-02 NOTE — Discharge Instr - Supplementary Instructions (Signed)
Information on my medicine - ELIQUIS (apixaban) This medication education was reviewed with me or my healthcare representative as part of my discharge preparation. The pharmacist that spoke with me during my hospital stay was: ____________________________ (pharmacist name) WHY WAS ELIQUIS PRESCRIBED FOR YOU? Eliquis was prescribed for you to reduce the risk of blood clots forming after orthopedic surgery. WHAT DO YOU NEED TO KNOW ABOUT ELIQUIS? Take your Eliquis TWICE DAILY - one tablet in the morning and one tablet in the evening with or without food. It would be best to take the dose about the same time each day. If you have difficulty swallowing the tablet whole please discuss with your pharmacist how to take the medication safely. Take Eliquis exactly as prescribed by your doctor and DO NOT stop taking Eliquis without talking to the doctor who prescribed the medication. Stopping without other medication to take the place of Eliquis may increase your risk of developing a clot. After discharge, you should have regular check-up appointments with your healthcare provider that is prescribing your Eliquis. WHAT DO YOU DO IF YOU MISS A DOSE? If a dose of ELIQUIS is not taken at the scheduled time, take it as soon as possible on the same day and twice-daily administration should be resumed. The dose should not be doubled to make up for a missed dose. Do not take more than one tablet of ELIQUIS at the same time. IMPORTANT SAFETY INFORMATION A possible side effect of Eliquis is bleeding. You should call your healthcare provider right away if you experience any of the following: ? Bleeding from an injury or your nose that does not stop. ? Unusual colored urine (red or dark brown) or unusual colored stools (red or black). ? Unusual bruising for unknown reasons. ? A serious fall or if you hit your head (even if there is no bleeding). Some medicines may interact with Eliquis and might  increase your risk of bleeding or clotting while on Eliquis. To help avoid this, consult your healthcare provider or pharmacist prior to using any new prescription or non-prescription medications, including herbals, vitamins, non-steroidal anti-inflammatory drugs (NSAIDs) and supplements. This website has more information on Eliquis (apixaban): [email or web address].com.cy.

## 2020-08-02 NOTE — Progress Notes (Signed)
   Subjective: 1 Day Post-Op Procedure(s) (LRB): TOTAL KNEE ARTHROPLASTY - [place] Neighbor to Assist (Left) Patient reports pain as mild.   Patient is well, and has had no acute complaints or problems Denies any CP, SOB, ABD pain. We will continue therapy today.  Plan is to go Home after hospital stay.  Objective: Vital signs in last 24 hours: Temp:  [97.9 F (36.6 C)-98.8 F (37.1 C)] 98.5 F (36.9 C) (04/01 0450) Pulse Rate:  [75-91] 89 (04/01 0450) Resp:  [0-24] 17 (04/01 0450) BP: (90-137)/(39-74) 137/74 (04/01 0450) SpO2:  [92 %-100 %] 99 % (04/01 0450)  Intake/Output from previous day: 03/31 0701 - 04/01 0700 In: 1660 [P.O.:360; I.V.:1300] Out: 1430 [Urine:1150; Drains:180; Blood:100] Intake/Output this shift: No intake/output data recorded.  Recent Labs    08/02/20 0501  HGB 11.4*   Recent Labs    08/02/20 0501  WBC 7.9  RBC 3.69*  HCT 35.0*  PLT 209   Recent Labs    08/02/20 0501  NA 136  K 4.4  CL 105  CO2 25  BUN 20  CREATININE 1.74*  GLUCOSE 98  CALCIUM 8.9   No results for input(s): LABPT, INR in the last 72 hours.  EXAM General - Patient is Alert, Appropriate and Oriented Extremity - Neurovascular intact Sensation intact distally Intact pulses distally Dorsiflexion/Plantar flexion intact No cellulitis present Compartment soft Dressing - dressing C/D/I and no drainage, prevena intact with out drainage Motor Function - intact, moving foot and toes well on exam.   Past Medical History:  Diagnosis Date  . Anxiety   . Arthritis   . Chronic kidney disease    early stages. creatnine recently was normal  . Depression   . GERD (gastroesophageal reflux disease)   . Hypertension   . Hypothyroidism     Assessment/Plan:   1 Day Post-Op Procedure(s) (LRB): TOTAL KNEE ARTHROPLASTY - [place] Neighbor to Assist (Left) Active Problems:   S/P TKR (total knee replacement) using cement, left  Estimated body mass index is 36.11 kg/m as calculated  from the following:   Height as of this encounter: 5\' 5"  (1.651 m).   Weight as of this encounter: 98.4 kg. Advance diet Up with therapy  Pain well controlled VSS Work on BM CM to assist with discharge to home with HHPT. Possible dc today if completion of PT goals  DVT Prophylaxis - TED hose and SCDs, eliquis Weight-Bearing as tolerated to left leg   T. , PA-C [facility] 08/02/2020, 7:58 AM

## 2020-08-02 NOTE — Progress Notes (Signed)
Physical Therapy Treatment Patient Details Name: Sara Hebert MRN: [id number] DOB: 1960/09/30 Today's Date: 08/02/2020    History of Present Illness Sara Hebert is a 60 y.o. female who presents today for history and physical for left total knee arthroplasty with Dr. Kennedy Bucker on 08/01/2020. Had R LE TKA 1 year ago.    PT Comments    Patient received in bed, had pain medicine half an hour ago. She is ready for PT. Patient is mod independent with bed mobility and supervision for transfers. She reports increased pain this session. Patient ambulated with RW and min guard 170 feet. Patient will continue to benefit from skilled PT while here to improve strength, rom and functional independence.      Follow Up Recommendations  Home health PT;Supervision - Intermittent     Equipment Recommendations  None recommended by PT    Recommendations for Other Services       Precautions / Restrictions Precautions Precautions: Fall;Knee Restrictions Weight Bearing Restrictions: Yes Other Position/Activity Restrictions: WBAT    Mobility  Bed Mobility Overal bed mobility: Modified Independent                  Transfers Overall transfer level: Needs assistance Equipment used: Rolling walker (2 wheeled) Transfers: Sit to/from [employer] Transfers Sit to Stand: Supervision Stand pivot transfers: Supervision          Ambulation/Gait Ambulation/Gait assistance: Min guard Gait Distance (Feet): 170 Feet Assistive device: Rolling walker (2 wheeled) Gait Pattern/deviations: Step-to pattern;Decreased step length - right;Decreased step length - left;Decreased stance time - left;Decreased weight shift to left Gait velocity: WFL   General Gait Details: Patient demonstrates good balance and safety awareness.   Stairs             Wheelchair Mobility    Modified Rankin (Stroke Patients Only)       Balance Overall balance assessment: Modified  Independent                                          Cognition Arousal/Alertness: Awake/alert Behavior During Therapy: WFL for tasks assessed/performed Overall Cognitive Status: Within Functional Limits for tasks assessed                                        Exercises Total Joint Exercises Ankle Circles/Pumps: AROM;10 reps;Left Quad Sets: AROM;10 reps;Left Hip ABduction/ADduction: AROM;10 reps;Left Straight Leg Raises: AROM;10 reps;Left Long Arc Quad: AROM;10 reps;Left Goniometric ROM: 0-90    General Comments        Pertinent Vitals/Pain Pain Assessment: Faces Faces Pain Scale: Hurts little more Pain Descriptors / Indicators: Sore Pain Intervention(s): Monitored during session;Premedicated before session;Repositioned;Ice applied    Home Living                      Prior Function            PT Goals (current goals can now be found in the care plan section) Acute Rehab PT Goals Patient Stated Goal: to return to sister's house PT Goal Formulation: With patient Time For Goal Achievement: 08/08/20 Potential to Achieve Goals: Good Progress towards PT goals: Progressing toward goals    Frequency    BID      PT Plan Current plan remains appropriate  Co-evaluation              AM-PAC PT "6 Clicks" Mobility   Outcome Measure  Help needed turning from your back to your side while in a flat bed without using bedrails?: A Little Help needed moving from lying on your back to sitting on the side of a flat bed without using bedrails?: A Little Help needed moving to and from a bed to a chair (including a wheelchair)?: A Little Help needed standing up from a chair using your arms (e.g., wheelchair or bedside chair)?: A Little Help needed to walk in hospital room?: A Little Help needed climbing 3-5 steps with a railing? : A Little 6 Click Score: 18    End of Session Equipment Utilized During Treatment: Gait  belt Activity Tolerance: Patient tolerated treatment well Patient left: in chair;with call bell/phone within reach;with family/visitor present Nurse Communication: Mobility status PT Visit Diagnosis: Other abnormalities of gait and mobility (R26.89);Difficulty in walking, not elsewhere classified (R26.2);Muscle weakness (generalized) (M62.81);Pain Pain - Right/Left: Left Pain - part of body: Knee     Time: 1030-1100 PT Time Calculation (min) (ACUTE ONLY): 30 min  Charges:  $Gait Training: 8-22 mins $Therapeutic Exercise: 8-22 mins                     Kemiyah Tarazon, PT, GCS 08/02/20,11:11 AM

## 2020-08-02 NOTE — Discharge Summary (Signed)
Physician Discharge Summary  Patient ID: Sara Hebert MRN: [id number] DOB/AGE: 06/10/1960 60 y.o.  Admit date: 08/01/2020 Discharge date: 08/03/2020 Admission Diagnoses:  S/P TKR (total knee replacement) using cement, left [Z96.652]   Discharge Diagnoses: Patient Active Problem List   Diagnosis Date Noted  . S/P TKR (total knee replacement) using cement, left 08/01/2020  . Status post total knee replacement using cement, right 08/03/2019    Past Medical History:  Diagnosis Date  . Anxiety   . Arthritis   . Chronic kidney disease    early stages. creatnine recently was normal  . Depression   . GERD (gastroesophageal reflux disease)   . Hypertension   . Hypothyroidism      Transfusion: None   Consultants (if any):   Discharged Condition: Improved  Hospital Course: Sara Hebert is an 60 y.o. female who was admitted 08/01/2020 with a diagnosis of left knee osteoarthritis and went to the operating room on 08/01/2020 and underwent the above named procedures.    Surgeries: Procedure(s): TOTAL KNEE ARTHROPLASTY - [place] Neighbor to Assist on 08/01/2020 Patient tolerated the surgery well. Taken to PACU where she was stabilized and then transferred to the orthopedic floor.  Started on Eliquis twice daily. Foot pumps applied bilaterally at 80 mm. Heels elevated on bed with rolled towels. No evidence of DVT. Negative Homan. Physical therapy started on day #1 for gait training and transfer. OT started day #1 for ADL and assisted devices.  Patient's foley was d/c on day #1. Patient's IV was d/c on day #2.  On post op day #2 patient was stable and ready for discharge to home with home health PT.  Implants: Medacta GMK sphere system with 2 left plusfemur, 3 lefttibia with short stem and 75mm insert. Size 2patella, all components cemented.  She was given perioperative antibiotics:  Anti-infectives (From admission, onward)   Start     Dose/Rate Route  Frequency Ordered Stop   08/01/20 1500  ceFAZolin (ANCEF) IVPB 2g/100 mL premix        2 g 200 mL/hr over 30 Minutes Intravenous Every 6 hours 08/01/20 1256 08/01/20 2117   08/01/20 0719  ceFAZolin (ANCEF) 2-4 GM/100ML-% IVPB       Note to Pharmacy: Kerman Passey, Cryst: cabinet override      08/01/20 0719 08/01/20 0917   08/01/20 0600  ceFAZolin (ANCEF) IVPB 2g/100 mL premix        2 g 200 mL/hr over 30 Minutes Intravenous On call to O.R. 07/31/20 2307 08/01/20 0941    .  She was given sequential compression devices, early ambulation, and teds, Eliquis for DVT prophylaxis.  She benefited maximally from the hospital stay and there were no complications.    Recent vital signs:  Vitals:   08/02/20 2337 08/03/20 0458  BP: (!) 148/88 (!) 154/91  Pulse: (!) 109 (!) 118  Resp: 16 18  Temp: 98.1 F (36.7 C) 98.3 F (36.8 C)  SpO2: 91% 98%    Recent laboratory studies:  Lab Results  Component Value Date   HGB 11.4 (L) 08/02/2020   HGB 12.7 07/22/2020   HGB 10.0 (L) 08/11/2019   Lab Results  Component Value Date   WBC 7.9 08/02/2020   PLT 209 08/02/2020   Lab Results  Component Value Date   INR 0.9 08/11/2019   Lab Results  Component Value Date   NA 136 08/02/2020   K 4.4 08/02/2020   CL 105 08/02/2020   CO2 25 08/02/2020   BUN  20 08/02/2020   CREATININE 1.74 (H) 08/02/2020   GLUCOSE 98 08/02/2020    Discharge Medications:   Allergies as of 08/03/2020   No Known Allergies     Medication List    STOP taking these medications   meloxicam 15 MG tablet Commonly known as: MOBIC     TAKE these medications   acetaminophen 325 MG tablet Commonly known as: TYLENOL Take 1-2 tablets (325-650 mg total) by mouth every 6 (six) hours as needed for mild pain (pain score 1-3 or temp > 100.5).   apixaban 5 MG Tabs tablet Commonly known as: Eliquis Take 2 tablets (10mg ) twice daily for 7 days, then 1 tablet (5mg ) twice daily   buPROPion 300 MG 24 hr tablet Commonly  known as: WELLBUTRIN XL Take 300 mg by mouth daily.   buPROPion 300 MG 24 hr tablet Commonly known as: WELLBUTRIN XL TAKE 1 TABLET BY MOUTH ONCE DAILY FOR MOOD   celecoxib 200 MG capsule Commonly known as: CELEBREX TAKE 1 CAPSULE BY MOUTH ONCE DAILY   citalopram 40 MG tablet Commonly known as: CELEXA Take 40 mg by mouth daily.   citalopram 40 MG tablet Commonly known as: CELEXA TAKE 1 TABLET BY MOUTH ONCE DAILY FOR DEPRESSION   colchicine 0.6 MG tablet Take 0.6 mg by mouth daily as needed (gout flares).   colchicine 0.6 MG tablet TAKE 1 TABLET BY MOUTH TWICE DAILY FOR 1 WEEK, THEN 1 TABLET ONCE A DAY   diphenhydrAMINE 50 MG capsule Commonly known as: BENADRYL Take 50 mg by mouth every 6 (six) hours as needed for itching or sleep.   diphenhydramine-acetaminophen 25-500 MG Tabs tablet Commonly known as: TYLENOL PM Take 2 tablets by mouth at bedtime as needed (pain/sleep.).   docusate sodium 100 MG capsule Commonly known as: COLACE Take 1 capsule (100 mg total) by mouth 2 (two) times daily. What changed:   when to take this  reasons to take this   HYDROmorphone 2 MG tablet Commonly known as: DILAUDID Take 1 tablet (2 mg total) by mouth every 4 (four) hours as needed for moderate pain or severe pain.   levothyroxine 150 MCG tablet Commonly known as: SYNTHROID Take 150 mcg by mouth daily before breakfast.   levothyroxine 150 MCG tablet Commonly known as: SYNTHROID TAKE 1 TABLET (150 MCG TOTAL) BY MOUTH ONCE DAILY   lisinopril 5 MG tablet Commonly known as: ZESTRIL Take 5 mg by mouth daily.   lisinopril 5 MG tablet Commonly known as: ZESTRIL TAKE 1 TABLET BY MOUTH ONCE DAILY   methocarbamol 500 MG tablet Commonly known as: ROBAXIN Take 1 tablet (500 mg total) by mouth every 6 (six) hours as needed for muscle spasms.   ondansetron 4 MG tablet Commonly known as: Zofran Take 1 tablet (4 mg total) by mouth every 8 (eight) hours as needed for nausea or  vomiting.   Saxenda 18 MG/3ML Sopn Generic drug: Liraglutide -Weight Management INJECT 1.8 MG SUBCUTANEOUSLY ONCE DAILY FOR 60 DAYS   Saxenda 18 MG/3ML Sopn Generic drug: Liraglutide -Weight Management Inject 3 mg into the skin daily.   sennosides-docusate sodium 8.6-50 MG tablet Commonly known as: SENOKOT-S Take 2 tablets by mouth daily.   Unifine Pentips 31G X 5 MM Misc Generic drug: Insulin Pen Needle USE AS DIRECTED WITH SAXENDA   Vitamin D (Ergocalciferol) 1.25 MG (50000 UNIT) Caps capsule Commonly known as: DRISDOL TAKE 1 CAPSULE BY MOUTH ONCE A WEEK       Diagnostic Studies: DG Knee 1-2 Views  Left  Result Date: 08/01/2020 CLINICAL DATA:  Postop left knee. EXAM: LEFT KNEE - 1-2 VIEW COMPARISON:  CT 01/30/2020.  Left knee series 04/13/2019. FINDINGS: Total left knee replacement. Hardware intact. Anatomic alignment. No acute bony abnormality. IMPRESSION: Total left knee replacement with anatomic alignment. Electronically Signed   By: Maisie Fus  Register   On: 08/01/2020 12:39    Disposition: Discharge disposition: 01-Home or Self Care          Follow-up Information    Gayatri, [place], PA-C Follow up in 2 week(s).   Specialties: Orthopedic Surgery, Emergency Medicine Contact information: [address] [place] [address] [phone]                Signed: Lenard Forth, Tawanna Cooler 08/03/2020, 5:19 AM

## 2020-08-03 LAB — CREATININE, SERUM
Creatinine, Ser: 1.11 mg/dL — ABNORMAL HIGH (ref 0.44–1.00)
GFR, Estimated: 58 mL/min — ABNORMAL LOW (ref >60)

## 2020-08-03 MED ORDER — APIXABAN 5 MG PO TABS: 5.0000 mg | ORAL_TABLET | Freq: Two times a day (BID) | ORAL | 2 refills | Status: AC

## 2020-08-03 NOTE — Progress Notes (Signed)
Physical Therapy Treatment Patient Details Name: Sara Hebert MRN: [id number] DOB: [birth year] Today's Date: 08/03/2020    History of Present Illness Sara Hebert is a 60 y.o. female who presented for left total knee arthroplasty with Dr. Kennedy Bucker on 08/01/2020. Had R LE TKA 1 year ago.    PT Comments    Pt was pleasant and motivated to participate during the session and made very good progress towards goals.  Pt required no physical assistance during the session and demonstrated good control and stability during ambulation and stair training.  Pt ambulated with slow, step-to gait pattern but was steady with no adverse symptoms other than mod L knee pain with SpO2 and HR WNL. Pt will benefit from HHPT upon discharge to safely address deficits listed in patient problem list for decreased caregiver assistance and eventual return to PLOF.      Follow Up Recommendations  Home health PT;Supervision - Intermittent     Equipment Recommendations  None recommended by PT    Recommendations for Other Services       Precautions / Restrictions Precautions Precautions: Fall;Knee Restrictions Weight Bearing Restrictions: Yes Other Position/Activity Restrictions: WBAT    Mobility  Bed Mobility Overal bed mobility: Modified Independent             General bed mobility comments: Extra time and effort only    Transfers Overall transfer level: Needs assistance Equipment used: Rolling walker (2 wheeled) Transfers: Sit to/from Stand Sit to Stand: Supervision         General transfer comment: Min verbal cues for general sequencing including BUE and L foot placement  Ambulation/Gait Ambulation/Gait assistance: Min guard Gait Distance (Feet): 200 Feet x 1, 125 Feet x 1 Assistive device: Rolling walker (2 wheeled) Gait Pattern/deviations: Step-to pattern;Antalgic;Decreased stance time - left;Decreased step length - right Gait velocity: decreased   General Gait  Details: Slow cadence but steady without LOB with min verbal cues for decreased UE WB on the walker   Stairs Stairs: Yes Stairs assistance: Min guard Stair Management: One rail Left;Step to pattern;Forwards Number of Stairs: 4 General stair comments: Min verbal and visual cues for sequencing with good carryover; pt steady with good eccentric and concentric control; pt able to ascend/descend 4 steps x 2 with family present during training and provided with proper guarding technique   Wheelchair Mobility    Modified Rankin (Stroke Patients Only)       Balance Overall balance assessment: Needs assistance   Sitting balance-Leahy Scale: Normal     Standing balance support: Bilateral upper extremity supported;During functional activity Standing balance-Leahy Scale: Good Standing balance comment: Min lean on the RW for support                            Cognition Arousal/Alertness: Awake/alert Behavior During Therapy: WFL for tasks assessed/performed Overall Cognitive Status: Within Functional Limits for tasks assessed                                        Exercises Total Joint Exercises Ankle Circles/Pumps: AROM;Left;10 reps Quad Sets: AROM;10 reps;Left;Strengthening;5 reps Gluteal Sets: Strengthening;Both;10 reps Hip ABduction/ADduction: AAROM;Left;5 reps Straight Leg Raises: AAROM;Left;5 reps Long Arc Quad: AROM;10 reps;Left;Strengthening;15 reps Knee Flexion: 15 reps;10 reps;Left;Strengthening;AROM Goniometric ROM: L knee AROM: 1-73 deg limited by wrapping Other Exercises Other Exercises: Positioning education to promote L knee ext PROM  and prevent heel pressure Other Exercises: 90 deg L turn training to prevent CKC twisting on the L knee Other Exercises: stair training with pt and family Other Exercises: car transfer training with pt and family with verbal sequencing and visual simulated demonstration Other Exercises: HEP review per  handout    General Comments        Pertinent Vitals/Pain Pain Assessment: 0-10 Pain Score: 4  Pain Location: L knee Pain Descriptors / Indicators: Aching;Sore Pain Intervention(s): Premedicated before session;Monitored during session    Home Living                      Prior Function            PT Goals (current goals can now be found in the care plan section) Progress towards PT goals: Progressing toward goals    Frequency    BID      PT Plan Current plan remains appropriate    Co-evaluation              AM-PAC PT "6 Clicks" Mobility   Outcome Measure  Help needed turning from your back to your side while in a flat bed without using bedrails?: A Little Help needed moving from lying on your back to sitting on the side of a flat bed without using bedrails?: A Little Help needed moving to and from a bed to a chair (including a wheelchair)?: A Little Help needed standing up from a chair using your arms (e.g., wheelchair or bedside chair)?: A Little Help needed to walk in hospital room?: A Little Help needed climbing 3-5 steps with a railing? : A Little 6 Click Score: 18    End of Session Equipment Utilized During Treatment: Gait belt Activity Tolerance: Patient tolerated treatment well Patient left: in chair;with call bell/phone within reach;with chair alarm set;with SCD's reapplied;Other (comment) (Polar care donned to L knee) Nurse Communication: Mobility status PT Visit Diagnosis: Other abnormalities of gait and mobility (R26.89);Difficulty in walking, not elsewhere classified (R26.2);Muscle weakness (generalized) (M62.81);Pain Pain - Right/Left: Left Pain - part of body: Knee     Time: 4627-0350 PT Time Calculation (min) (ACUTE ONLY): 53 min  Charges:  $Gait Training: 23-37 mins $Therapeutic Exercise: 8-22 mins $Therapeutic Activity: 8-22 mins                     D. Scott Kambrie Eddleman PT, DPT 08/03/20, 1:03 PM

## 2020-08-03 NOTE — Progress Notes (Signed)
VSS, IV catheter removed. Discussed discharge instructions with pt/husband both verbalized understanding of the information. Changed pt wound vac to the Prevena, pt went home with honeycomb dressing, bone foam and polar care. Transported pt via wheelchair to private car.

## 2020-08-03 NOTE — Progress Notes (Signed)
VSS, IV removed went over discharge packet with pt/husband both verbalized understanding. Changed wound vac to Prevena, pt went home with bone foam and polar care. Transported pt via wheelchair with belongings to private car.

## 2020-08-03 NOTE — TOC Initial Note (Signed)
Transition of Care [facility]) - Initial/Assessment Note    Patient Details  Name: Sara Hebert MRN: [id number] Date of Birth: [birth year]  [facility]) CM/SW Contact:    Su Hilt, RN Phone Number: 08/03/2020, 9:54 AM  Clinical Narrative:               Met with the patient and her spouse in the room She has a RW at home, She had surgery about a year ago, Was unable to locate a [facility] agency to accept her insurance, she is willing to go to Outpatient, Notified Dr Rudene Christians, his office will set it up on Monday and call her for an appointment, provided her with a note showing admission dates  Expected Discharge Plan: Home/Self Care Barriers to Discharge: Barriers Resolved   Patient Goals and CMS Choice Patient states their goals for this hospitalization and ongoing recovery are:: get home      Expected Discharge Plan and Services Expected Discharge Plan: Home/Self Care         Expected Discharge Date: 08/03/20                                    Prior Living Arrangements/Services                       Activities of Daily Living Home Assistive Devices/Equipment: None ADL Screening (condition at time of admission) Patient's cognitive ability adequate to safely complete daily activities?: Yes Is the patient deaf or have difficulty hearing?: No Does the patient have difficulty seeing, even when wearing glasses/contacts?: No Does the patient have difficulty concentrating, remembering, or making decisions?: No Patient able to express need for assistance with ADLs?: No Does the patient have difficulty dressing or bathing?: No Independently performs ADLs?: Yes (appropriate for developmental age) Does the patient have difficulty walking or climbing stairs?: Yes Weakness of Legs: None Weakness of Arms/Hands: None  Permission Sought/Granted                  Emotional Assessment              Admission diagnosis:  S/P TKR (total knee  replacement) using cement, left [Z96.652] Patient Active Problem List   Diagnosis Date Noted  . S/P TKR (total knee replacement) using cement, left 08/01/2020  . Status post total knee replacement using cement, right 08/03/2019   PCP:  [facility]:   [address] [place] [address] Phone: [phone] Fax: [phone]     Social Determinants of Health (SDOH) Interventions    Readmission Risk Interventions No flowsheet data found.

## 2020-08-03 NOTE — Progress Notes (Signed)
To whom it may concern, This patient Sara Hebert was admitted to the Hospital for Total Knee replacement on 08/01/20 and discharged on 08/03/20

## 2020-08-03 NOTE — Progress Notes (Signed)
   Subjective: 2 Days Post-Op Procedure(s) (LRB): TOTAL KNEE ARTHROPLASTY - [place] Neighbor to Assist (Left) Patient reports pain as mild.   Patient is well, and has had no acute complaints or problems Denies any CP, SOB, ABD pain. We will continue therapy today.  Plan is to go Home after hospital stay.  Objective: Vital signs in last 24 hours: Temp:  [98.1 F (36.7 C)-98.6 F (37 C)] 98.3 F (36.8 C) (04/02 0458) Pulse Rate:  [93-118] 118 (04/02 0458) Resp:  [16-20] 18 (04/02 0458) BP: (119-164)/(76-91) 154/91 (04/02 0458) SpO2:  [91 %-100 %] 98 % (04/02 0458)  Intake/Output from previous day: 04/01 0701 - 04/02 0700 In: -  Out: 1530 [Urine:1500; Drains:30] Intake/Output this shift: Total I/O In: -  Out: 1230 [Urine:1200; Drains:30]  Recent Labs    08/02/20 0501  HGB 11.4*   Recent Labs    08/02/20 0501  WBC 7.9  RBC 3.69*  HCT 35.0*  PLT 209   Recent Labs    08/02/20 0501  NA 136  K 4.4  CL 105  CO2 25  BUN 20  CREATININE 1.74*  GLUCOSE 98  CALCIUM 8.9   No results for input(s): LABPT, INR in the last 72 hours.  EXAM General - Patient is Alert, Appropriate and Oriented Extremity - Neurovascular intact Sensation intact distally Intact pulses distally Dorsiflexion/Plantar flexion intact No cellulitis present Compartment soft Dressing - dressing C/D/I and no drainage, prevena intact with out drainage Motor Function - intact, moving foot and toes well on exam.  Ambulated 170 feet with physical therapy  Past Medical History:  Diagnosis Date  . Anxiety   . Arthritis   . Chronic kidney disease    early stages. creatnine recently was normal  . Depression   . GERD (gastroesophageal reflux disease)   . Hypertension   . Hypothyroidism     Assessment/Plan:   2 Days Post-Op Procedure(s) (LRB): TOTAL KNEE ARTHROPLASTY - [place] Neighbor to Assist (Left) Active Problems:   S/P TKR (total knee replacement) using cement, left  Estimated body mass index  is 36.11 kg/m as calculated from the following:   Height as of this encounter: 5\' 5"  (1.651 m).   Weight as of this encounter: 98.4 kg. Advance diet Up with therapy  Pain well controlled VSS Work on BM CM to assist with discharge to home with HHPT. Possible dc today if completion of PT goals  DVT Prophylaxis - TED hose and SCDs, eliquis Weight-Bearing as tolerated to left leg   , PA-C [facility] 08/03/2020, 5:17 AM

## 2020-08-05 ENCOUNTER — Other Ambulatory Visit: Payer: Self-pay | Admitting: *Deleted

## 2020-08-05 NOTE — Patient Outreach (Signed)
[facility]) Care Management  08/05/2020  Sara Hebert 1961/05/01 [id number]   Transition of care telephone call  Referral received:08/01/20 Initial outreach:08/05/20 Insurance: [facility]  Initial unsuccessful telephone call to patient's preferred number in order to complete transition of care assessment; no answer, left HIPAA compliant voicemail message requesting return call.   Objective: Per electronic medical record, Sara Hebert  was hospitalized at [facility] from 3/31-08/03/20  Left  Total Knee Arthroplasty . PMHX include: Right total knee replacement.  She  was discharged to home on 08/03/20 with plans noted by inpatient [facility] team  for Dr. Rosita Kea office  to setup outpatient physical therapy orders on Monday, 08/05/20.     Plan: This RNCM will route unsuccessful outreach letter with [facility] Care Management pamphlet and 24 hour Nurse Advice Line Magnet to [employer] Care Management clinical pool to be mailed to patient's home address. This RNCM will attempt another outreach within 4 business days.  [name], RN, BSN  [facility] Care Management,Care Management Coordinator  [phone]- Mobile [phone]- Toll Free Main Office

## 2020-08-08 ENCOUNTER — Other Ambulatory Visit: Payer: Self-pay | Admitting: *Deleted

## 2020-08-08 ENCOUNTER — Other Ambulatory Visit: Payer: Self-pay

## 2020-08-08 MED ORDER — HYDROMORPHONE HCL 2 MG PO TABS
ORAL_TABLET | ORAL | 0 refills | Status: DC
  Filled 2020-08-08: qty 20, 5d supply, fill #0

## 2020-08-08 NOTE — Patient Outreach (Signed)
[facility]) Care Management  08/08/2020  Sara Hebert [birth year] [id number]  Covering Ander Purpura, RN  Transition of care telephone call  Outreach #2  Usuccessful telephone call to patient's preferred number in order to complete transition of care assessment; no answer, left HIPAA compliant voicemail message requesting return call.   Plan: RNCM will attempt another outreach within 4 business days.  Elliot Cousin, RN Care Management Coordinator [facility] Main Office [phone]

## 2020-08-09 ENCOUNTER — Other Ambulatory Visit: Payer: Self-pay

## 2020-08-13 ENCOUNTER — Other Ambulatory Visit: Payer: Self-pay | Admitting: *Deleted

## 2020-08-13 NOTE — Patient Outreach (Signed)
[facility]) Care Management  08/13/2020  Sara Hebert December 23, 1960 [id number]   Transition of care call Referral received: 08/01/20 Initial outreach attempt: 08/05/20 Insurance: [employer]  #3 Outreach attempt   Third unsuccessful telephone call to patient's preferred contact number in order to complete post hospital discharge transition of care assessment; no answer, left HIPAA compliant message requesting return call.   Objective: Per electronic medical record[facility]  Left  Total Knee Arthroplasty . PMHX include: Right total knee replacement.  She  was discharged to home on 08/03/20 with plans noted by inpatient [facility] team  for Dr. Rosita Kea office  to setup outpatient physical therapy orders on Monday, 08/05/20.   Plan: If no return call from patient, will plan return call in the next 3 weeks.   [name], RN, BSN  [facility] Care Management,Care Management Coordinator  [phone]- Mobile [phone]- Toll Free Main Office

## 2020-08-18 ENCOUNTER — Emergency Department
Admission: EM | Admit: 2020-08-18 | Discharge: 2020-08-18 | Disposition: A | Discharge status: Home / Self Care | Payer: No Typology Code available for payment source | Attending: Emergency Medicine | Admitting: Emergency Medicine

## 2020-08-18 ENCOUNTER — Emergency Department: Payer: No Typology Code available for payment source

## 2020-08-18 ENCOUNTER — Other Ambulatory Visit: Payer: Self-pay

## 2020-08-18 DIAGNOSIS — M545 Low back pain, unspecified: Secondary | ICD-10-CM | POA: Diagnosis present

## 2020-08-18 DIAGNOSIS — Z7901 Long term (current) use of anticoagulants: Secondary | ICD-10-CM | POA: Diagnosis not present

## 2020-08-18 DIAGNOSIS — M5432 Sciatica, left side: Secondary | ICD-10-CM

## 2020-08-18 DIAGNOSIS — Z96653 Presence of artificial knee joint, bilateral: Secondary | ICD-10-CM | POA: Insufficient documentation

## 2020-08-18 DIAGNOSIS — M5442 Lumbago with sciatica, left side: Secondary | ICD-10-CM | POA: Diagnosis not present

## 2020-08-18 DIAGNOSIS — Z79899 Other long term (current) drug therapy: Secondary | ICD-10-CM | POA: Insufficient documentation

## 2020-08-18 DIAGNOSIS — I129 Hypertensive chronic kidney disease with stage 1 through stage 4 chronic kidney disease, or unspecified chronic kidney disease: Secondary | ICD-10-CM | POA: Diagnosis not present

## 2020-08-18 DIAGNOSIS — E039 Hypothyroidism, unspecified: Secondary | ICD-10-CM | POA: Insufficient documentation

## 2020-08-18 DIAGNOSIS — N189 Chronic kidney disease, unspecified: Secondary | ICD-10-CM | POA: Diagnosis not present

## 2020-08-18 DIAGNOSIS — M48061 Spinal stenosis, lumbar region without neurogenic claudication: Secondary | ICD-10-CM | POA: Diagnosis not present

## 2020-08-18 LAB — CBC WITH DIFFERENTIAL/PLATELET
Abs Immature Granulocytes: 0.05 K/uL (ref 0.00–0.07)
Basophils Absolute: 0.1 K/uL (ref 0.0–0.1)
Basophils Relative: 1 %
Eosinophils Absolute: 0.3 K/uL (ref 0.0–0.5)
Eosinophils Relative: 3 %
HCT: 36.3 % (ref 36.0–46.0)
Hemoglobin: 12.3 g/dL (ref 12.0–15.0)
Immature Granulocytes: 1 %
Lymphocytes Relative: 26 %
Lymphs Abs: 1.9 K/uL (ref 0.7–4.0)
MCH: 31.7 pg (ref 26.0–34.0)
MCHC: 33.9 g/dL (ref 30.0–36.0)
MCV: 93.6 fL (ref 80.0–100.0)
Monocytes Absolute: 0.5 K/uL (ref 0.1–1.0)
Monocytes Relative: 7 %
Neutro Abs: 4.7 K/uL (ref 1.7–7.7)
Neutrophils Relative %: 62 %
Platelets: 470 K/uL — ABNORMAL HIGH (ref 150–400)
RBC: 3.88 MIL/uL (ref 3.87–5.11)
RDW: 13.2 % (ref 11.5–15.5)
WBC: 7.4 K/uL (ref 4.0–10.5)
nRBC: 0 % (ref 0.0–0.2)

## 2020-08-18 LAB — COMPREHENSIVE METABOLIC PANEL
ALT: 15 U/L (ref 0–44)
AST: 36 U/L (ref 15–41)
Albumin: 4.2 g/dL (ref 3.5–5.0)
Alkaline Phosphatase: 92 U/L (ref 38–126)
Anion gap: 10 (ref 5–15)
BUN: 16 mg/dL (ref 6–20)
CO2: 27 mmol/L (ref 22–32)
Calcium: 9.3 mg/dL (ref 8.9–10.3)
Chloride: 103 mmol/L (ref 98–111)
Creatinine, Ser: 1.44 mg/dL — ABNORMAL HIGH (ref 0.44–1.00)
GFR, Estimated: 42 mL/min — ABNORMAL LOW (ref >60)
Glucose, Bld: 89 mg/dL (ref 70–99)
Potassium: 5.3 mmol/L — ABNORMAL HIGH (ref 3.5–5.1)
Sodium: 140 mmol/L (ref 135–145)
Total Bilirubin: 1.2 mg/dL (ref 0.3–1.2)
Total Protein: 7.8 g/dL (ref 6.5–8.1)

## 2020-08-18 LAB — MAGNESIUM: Magnesium: 2.1 mg/dL (ref 1.7–2.4)

## 2020-08-18 LAB — LIPASE, BLOOD: Lipase: 47 U/L (ref 11–51)

## 2020-08-18 LAB — CK: Total CK: 102 U/L (ref 38–234)

## 2020-08-18 MED ORDER — HYDROMORPHONE HCL 1 MG/ML IJ SOLN
1.0000 mg | Freq: Once | INTRAMUSCULAR | Status: AC
  Administered 2020-08-18: 1 mg via INTRAVENOUS
  Filled 2020-08-18: qty 1

## 2020-08-18 MED ORDER — LIDOCAINE 5 % EX PTCH
1.0000 | MEDICATED_PATCH | CUTANEOUS | Status: DC
  Administered 2020-08-18: 1 via TRANSDERMAL
  Filled 2020-08-18: qty 1

## 2020-08-18 MED ORDER — ONDANSETRON HCL 4 MG/2ML IJ SOLN
4.0000 mg | Freq: Once | INTRAMUSCULAR | Status: AC
  Administered 2020-08-18: 4 mg via INTRAVENOUS
  Filled 2020-08-18: qty 2

## 2020-08-18 MED ORDER — IOHEXOL 300 MG/ML  SOLN
80.0000 mL | Freq: Once | INTRAMUSCULAR | Status: AC | PRN
  Administered 2020-08-18: 80 mL via INTRAVENOUS

## 2020-08-18 MED ORDER — LACTATED RINGERS IV BOLUS: 1000.0000 mL | Freq: Once | INTRAVENOUS | Status: DC

## 2020-08-18 NOTE — ED Provider Notes (Signed)
[facility] Emergency Department Provider Note  ____________________________________________   Event Date/Time   First MD Initiated Contact with Patient 08/18/20 1133     (approximate)  I have reviewed the triage vital signs and the nursing notes.   HISTORY  Chief Complaint Back Pain   HPI Sara Hebert is a 60 y.o. female the past medical history of anxiety, arthritis, CKD, GERD, HTN, hypothyroidism, left-sided sciatica lower back pain, depression and recent left TKR on 3/31 who presents for assessment of 3 days of worsening left lower back pain rating down the left leg.  Patient denies any recent injuries falls or precipitating trauma.  States she has never had pain this bad.  States it is so bad she can even walk today.  He denies any fevers, chills, chest pain, cough, shortness of breath, abdominal pain, vomiting, diarrhea, dysuria, incontinence, numbness, pain in her right lower back or right lower extremity or in the left lower extremity past the mid thigh.  No headache and earache, sore throat, upper extremity pain weakness numbness or tingling or any other acute concerns.  She does note she is on Eliquis prescribed after her surgery to prevent blood clots.  She has taken some Tylenol at home this did not help but she stopped taking her Dilaudid which was prescribed after recent knee surgery last week.         Past Medical History:  Diagnosis Date  . Anxiety   . Arthritis   . Chronic kidney disease    early stages. creatnine recently was normal  . Depression   . GERD (gastroesophageal reflux disease)   . Hypertension   . Hypothyroidism     Patient Active Problem List   Diagnosis Date Noted  . S/P TKR (total knee replacement) using cement, left 08/01/2020  . Status post total knee replacement using cement, right 08/03/2019    Past Surgical History:  Procedure Laterality Date  . ABDOMINAL HYSTERECTOMY    . BREAST BIOPSY  1997    neg  . CHOLECYSTECTOMY    . JOINT REPLACEMENT    . TOTAL KNEE ARTHROPLASTY Right 08/03/2019   Procedure: RIGHT TOTAL KNEE ARTHROPLASTY;  Surgeon: Kennedy Bucker, MD;  Location: [facility] ORS;  Service: Orthopedics;  Laterality: Right;  . TOTAL KNEE ARTHROPLASTY Left 08/01/2020   Procedure: TOTAL KNEE ARTHROPLASTY - [place] Neighbor to Assist;  Surgeon: Kennedy Bucker, MD;  Location: [facility] ORS;  Service: Orthopedics;  Laterality: Left;    Prior to Admission medications   Medication Sig Start Date End Date Taking? Authorizing Provider  acetaminophen (TYLENOL) 325 MG tablet Take 1-2 tablets (325-650 mg total) by mouth every 6 (six) hours as needed for mild pain (pain score 1-3 or temp > 100.5). 08/02/20   Evon Slack, PA-C  apixaban (ELIQUIS) 5 MG TABS tablet Take 2 tablets (10mg ) twice daily for 7 days, then 1 tablet (5mg ) twice daily Patient not taking: Reported on 07/10/2020 08/11/19   09/09/2020, MD  apixaban (ELIQUIS) 5 MG TABS tablet Take 1 tablet (5 mg total) by mouth 2 (two) times daily. 08/03/20   Sharyn Creamer, MD  buPROPion (WELLBUTRIN XL) 300 MG 24 hr tablet TAKE 1 TABLET BY MOUTH ONCE DAILY FOR MOOD 06/12/20 06/12/21  08/10/20, MD  celecoxib (CELEBREX) 200 MG capsule TAKE 1 CAPSULE BY MOUTH ONCE DAILY 01/22/20 01/21/21  01/24/20, MD  citalopram (CELEXA) 40 MG tablet TAKE 1 TABLET BY MOUTH ONCE DAILY FOR DEPRESSION 06/12/20 06/12/21  08/10/20, MD  colchicine 0.6 MG tablet Take 0.6 mg by mouth daily as needed (gout flares).    [provider]  colchicine 0.6 MG tablet TAKE 1 TABLET BY MOUTH TWICE DAILY FOR 1 WEEK, THEN 1 TABLET ONCE A DAY 07/07/19 07/06/20  Enid Baas, MD  diphenhydrAMINE (BENADRYL) 50 MG capsule Take 50 mg by mouth every 6 (six) hours as needed for itching or sleep.    [provider]  diphenhydramine-acetaminophen (TYLENOL PM) 25-500 MG TABS tablet Take 2 tablets by mouth at bedtime as needed (pain/sleep.).    [provider]   docusate sodium (COLACE) 100 MG capsule Take 1 capsule (100 mg total) by mouth 2 (two) times daily. Patient taking differently: Take 100 mg by mouth daily as needed for mild constipation or moderate constipation. 08/05/19   Evon Slack, PA-C  HYDROmorphone (DILAUDID) 2 MG tablet Take 1 tablet (2 mg total) by mouth every 4 (four) hours as needed for moderate pain or severe pain. 08/02/20   Evon Slack, PA-C  HYDROmorphone (DILAUDID) 2 MG tablet Take 1 tablet (2 mg total) by mouth every 6 (six) hours as needed for Pain for up to 5 days 08/08/20     Insulin Pen Needle 31G X 5 MM MISC USE AS DIRECTED WITH SAXENDA 06/04/20 06/04/21  Enid Baas, MD  levothyroxine (SYNTHROID) 150 MCG tablet TAKE 1 TABLET (150 MCG TOTAL) BY MOUTH ONCE DAILY 03/04/20 03/04/21  Enid Baas, MD  Liraglutide -Weight Management 18 MG/3ML SOPN INJECT 1.8 MG SUBCUTANEOUSLY ONCE DAILY FOR 60 DAYS 03/25/20 03/25/21  Enid Baas, MD  lisinopril (ZESTRIL) 5 MG tablet TAKE 1 TABLET BY MOUTH ONCE DAILY 11/08/19 11/07/20  Enid Baas, MD  methocarbamol (ROBAXIN) 500 MG tablet Take 1 tablet (500 mg total) by mouth every 6 (six) hours as needed for muscle spasms. 08/02/20   Evon Slack, PA-C  SAXENDA 18 MG/3ML SOPN Inject 3 mg into the skin daily. 05/07/20   [provider]  sennosides-docusate sodium (SENOKOT-S) 8.6-50 MG tablet Take 2 tablets by mouth daily. 08/02/20   Evon Slack, PA-C  Vitamin D, Ergocalciferol, (DRISDOL) 1.25 MG (50000 UNIT) CAPS capsule TAKE 1 CAPSULE BY MOUTH ONCE A WEEK 07/10/20 07/10/21  Enid Baas, MD  enoxaparin (LOVENOX) 40 MG/0.4ML injection Inject 0.4 mLs (40 mg total) into the skin daily for 14 days. 08/05/19 08/11/19  Evon Slack, PA-C    Allergies Patient has no known allergies.  Family History  Problem Relation Age of Onset  . Kidney failure Mother   . Breast cancer Mother 32  . Healthy Father     Social History Social History   Tobacco Use  . Smoking  status: Never Smoker  . Smokeless tobacco: Never Used  Vaping Use  . Vaping Use: Never used  Substance Use Topics  . Alcohol use: Not Currently    Comment: occasional mixed drink. 6 in a year!!  . Drug use: Never    Review of Systems  Review of Systems  Constitutional: Negative for chills and fever.  HENT: Negative for sore throat.   Eyes: Negative for pain.  Respiratory: Negative for cough and stridor.   Cardiovascular: Negative for chest pain.  Gastrointestinal: Negative for vomiting.  Musculoskeletal: Positive for back pain ( Left lower).  Skin: Negative for rash.  Neurological: Positive for focal weakness ( L hip) and weakness. Negative for seizures, loss of consciousness and headaches.  Psychiatric/Behavioral: Negative for suicidal ideas.  All other systems reviewed and are negative.  ____________________________________________   PHYSICAL EXAM:  VITAL SIGNS: ED Triage Vitals  Enc Vitals Group     BP 08/18/20 1102 (!) 157/84     Pulse Rate 08/18/20 1102 (!) 106     Resp 08/18/20 1102 (!) 24     Temp 08/18/20 1102 99.3 F (37.4 C)     Temp Source 08/18/20 1102 Oral     SpO2 08/18/20 1102 98 %     Weight 08/18/20 1103 208 lb (94.3 kg)     Height 08/18/20 1103 5\' 5"  (1.651 m)     Head Circumference --      Peak Flow --      Pain Score 08/18/20 1103 10     Pain Loc --      Pain Edu? --      Excl. in GC? --    Vitals:   08/18/20 1448 08/18/20 1508  BP:  128/84  Pulse: 91   Resp:    Temp:    SpO2: 97%    Physical Exam Vitals and nursing note reviewed.  Constitutional:      General: She is not in acute distress.    Appearance: She is well-developed.  HENT:     Head: Normocephalic and atraumatic.     Right Ear: External ear normal.     Left Ear: External ear normal.     Nose: Nose normal.  Eyes:     Conjunctiva/sclera: Conjunctivae normal.  Cardiovascular:     Rate and Rhythm: Normal rate and regular rhythm.     Heart sounds: No murmur  heard.   Pulmonary:     Effort: Pulmonary effort is normal. No respiratory distress.     Breath sounds: Normal breath sounds.  Abdominal:     Palpations: Abdomen is soft.     Tenderness: There is no abdominal tenderness.  Musculoskeletal:     Cervical back: Neck supple.  Skin:    General: Skin is warm and dry.  Neurological:     Mental Status: She is alert.     Patient has symmetric strength in her bilateral lower extremities.  2+ bilateral DP pulses.  Sensation intact light touch of both extremities.  Incision over the anterior left knee appears clean dry and intact.  Patient is able to range her left hip and left knee up to 90 degrees.  She has significant pain on palpation of her left gluteal muscles and left SI joint.  She also has significant pain on straight leg testing in the left but not on the right.  No step-offs tenderness or deformities over the T or L-spine although the skin is quite cold and a little red as patient had an ice pack applied prior to arrival which apparently had been on for at least 20 minutes. ____________________________________________   LABS (all labs ordered are listed, but only abnormal results are displayed)  Labs Reviewed  CBC WITH DIFFERENTIAL/PLATELET - Abnormal; Notable for the following components:      Result Value   Platelets 470 (*)    All other components within normal limits  COMPREHENSIVE METABOLIC PANEL - Abnormal; Notable for the following components:   Potassium 5.3 (*)    Creatinine, Ser 1.44 (*)    GFR, Estimated 42 (*)    All other components within normal limits  CK  MAGNESIUM  LIPASE, BLOOD  URINALYSIS, COMPLETE (UACMP) WITH MICROSCOPIC   ____________________________________________  EKG  ____________________________________________  RADIOLOGY  ED MD interpretation: No evidence of psoas hematoma fracture dislocation or other  clear acute abdominopelvic process.  There is fairly significant lumbar spondylosis and  degenerative disease causing bilateral foraminal impingement at L4-L5 and L5-S1 nerve roots.  There is also a cystic lesion around the right kidney.  Official radiology report(s): CT ABDOMEN PELVIS W CONTRAST  Result Date: 08/18/2020 CLINICAL DATA:  Back pain EXAM: CT ABDOMEN AND PELVIS WITH CONTRAST TECHNIQUE: Multidetector CT imaging of the abdomen and pelvis was performed using the standard protocol following bolus administration of intravenous contrast. CONTRAST:  80mL OMNIPAQUE IOHEXOL 300 MG/ML  SOLN COMPARISON:  None. FINDINGS: Lower chest: Unremarkable Hepatobiliary: Borderline intrahepatic biliary dilatation. Common hepatic duct 1.0 cm in diameter, common bile duct 0.7 cm in diameter. Cholecystectomy. No significant focal hepatic lesion identified. Pancreas: Unremarkable Spleen: Unremarkable Adrenals/Urinary Tract: Interposed between the right adrenal gland and the right kidney, but not appreciably tangential to either, a retroperitoneal simple appearing cystic lesion measures 2.3 by 1.9 by 2.9 cm, internal density -1 Hounsfield unit. Top differential diagnostic considerations are cystic lymphangioma, primary mucinous cystadenoma, or benign cystic mesothelioma. The kidneys and adrenal glands appear normal. No significant ureteral or bladder abnormality observed. Stomach/Bowel: Unremarkable.  Normal appendix. Vascular/Lymphatic: Unremarkable. Reproductive: Uterus absent.  Adnexa unremarkable. Other: No supplemental non-categorized findings. Musculoskeletal: Bilateral acetabular spurring. Degenerative disc disease at L4-5 and L5-S1. In conjunction with degenerative facet arthropathy there is bilateral foraminal stenosis at both of these levels. There is also a mild disc bulge at L3-4. Small umbilical hernia contains adipose tissue. Both psoas muscles appear normal and symmetric. No appreciable paraspinal mass. No discrete iliacus muscle asymmetry. No sciatic notch impingement. IMPRESSION: 1. Lumbar  spondylosis and degenerative disc disease causing bilateral foraminal impingement at L4-5 and L5-S1. 2. Cystic lesion in the right perirenal space, not definitively connected to the kidney or adrenal gland. Top differential diagnostic considerations are cystic lymphangioma, primary mucinous cystadenoma, or benign cystic mesothelioma. If the patient has a significant history pancreatitis then this could alternatively represent a residuum from a pseudocyst. 3. Both psoas muscles appear normal and symmetric by CT. 4. Mild extrahepatic biliary dilatation although this may be a physiologic response to cholecystectomy. Electronically Signed   By: Gaylyn Rong M.D.   On: 08/18/2020 14:31   CT L-SPINE NO CHARGE  Result Date: 08/18/2020 CLINICAL DATA:  Back pain. EXAM: CT LUMBAR SPINE WITHOUT CONTRAST TECHNIQUE: Multidetector CT imaging of the lumbar spine was performed without intravenous contrast administration. Multiplanar CT image reconstructions were also generated. COMPARISON:  None. FINDINGS: Segmentation: The lowest lumbar type non-rib-bearing vertebra is labeled as L5. Alignment: No vertebral subluxation is observed. Vertebrae: No lumbar spine fracture or acute bony findings. Loss of disc height at L4-5 and L5-S1 compatible with degenerative disc disease. Paraspinal and other soft tissues: No significant abnormality of either psoas muscle. No paraspinal mass. Cystic right retroperitoneal lesion, please see CT abdomen for complete discussion. Disc levels: L1-2: Unremarkable. L2-3: No impingement, mild disc bulge. L3-4: Mild left foraminal stenosis due to disc bulge and degenerative left facet spurring. Borderline central narrowing of the thecal sac. L4-5: Moderate bilateral foraminal stenosis due to diffuse disc bulge and degenerative facet spurring. Moderate central narrowing of the thecal sac. L5-S1: Moderate left and mild right foraminal stenosis due to intervertebral spurring, facet spurring, and disc  bulge. IMPRESSION: 1. Lower lumbar spondylosis and degenerative disc disease causing moderate impingement at L4-5 and L5-S1, and mild impingement at L3-4, as noted above. Electronically Signed   By: Gaylyn Rong M.D.   On: 08/18/2020 14:34    ____________________________________________  PROCEDURES  Procedure(s) performed (including Critical Care):  .1-3 Lead EKG Interpretation Performed by: Gilles ChiquitoSmith, Derk Doubek P, MD Authorized by: Gilles ChiquitoSmith, Ketzia Guzek P, MD     Interpretation: normal     ECG rate assessment: normal     Rhythm: sinus rhythm     Ectopy: none     Conduction: normal       ____________________________________________   INITIAL IMPRESSION / ASSESSMENT AND PLAN / ED COURSE      Patient presents with above-stated history exam for assessment of left lower back pain rating down her left leg in the setting of known sciatica and recent surgery in the left knee currently being anticoagulated.  On arrival she is tachypneic and hypertensive as well as a little tachycardic otherwise afebrile with stable vital signs on room air.  She does have positive straight leg test and significant tenderness of her left SI joint and left gluteal muscles but no obvious other focal deficits or evidence of infection or trauma on exam.  CT abdomen pelvis as well as T-spine reformats shows no clear fracture dislocation psoas hematoma or other clear acute abdominal pelvic process but does show fairly significant degenerative disc disease and disc disease at L4-L5 and S1 with foraminal stenosis.  Suspect this is likely etiology for patient's pain.  No evidence of psoas hematoma initially concerned given patient's anticoagulated.  Incidentally noted cyst this described above which I discussed with patient recommendation for outpatient surveillance imaging to be organized by her PCP.  CMP shows no significant electrolyte or metabolic derangements with kidney function close to baseline as it seems that  his range of 21.1 and 1.7 over the last couple of weeks.  CKs not consistent with myositis.  CBC shows no leukocytosis or acute anemia.  Lipase and exam unremarkable.  On reassessment patient states she feels much better after below noted analgesia and was able to ambulate with steady gait with some assistance which he states is baseline since her surgery and she is a walker to get around at home.  Given improvement and symptoms with otherwise reassuring exam work-up likely source for her symptoms i.e. foraminal stenosis and sciatica I think she is safe for discharge.  Recommended close outpatient PCP follow-up and may recommend physical therapy as well as neurosurgery evaluation for possible surgery if this becomes recurrent or persistent problem for the patient.  Patient discharged stable condition.  Strict return cautions advised discussed.  ____________________________________________   FINAL CLINICAL IMPRESSION(S) / ED DIAGNOSES  Final diagnoses:  Lower back pain  Sciatica of left side  Lumbar foraminal stenosis    Medications  lidocaine (LIDODERM) 5 % 1 patch (1 patch Transdermal Patch Applied 08/18/20 1242)  lactated ringers bolus 1,000 mL (has no administration in time range)  HYDROmorphone (DILAUDID) injection 1 mg (1 mg Intravenous Given 08/18/20 1241)  ondansetron (ZOFRAN) injection 4 mg (4 mg Intravenous Given 08/18/20 1241)  iohexol (OMNIPAQUE) 300 MG/ML solution 80 mL (80 mLs Intravenous Contrast Given 08/18/20 1358)     ED Discharge Orders    None       Note:  This document was prepared using Dragon voice recognition software and may include unintentional dictation errors.   Gilles ChiquitoSmith, Kaytlynne Neace P, MD 08/18/20 [phone]

## 2020-08-18 NOTE — ED Notes (Signed)
Pt brought back to treatment room   She was not able to stand on her own and had voided incont on stretcheer

## 2020-08-18 NOTE — ED Triage Notes (Signed)
Pt had left knee surgery 2 weeks ago and is having left lower back pain since Saturday, thinks it sciatica pain, pt is moaning and crying in pain. States she took 2 tylenol PM

## 2020-08-18 NOTE — ED Notes (Signed)
Pt has been provided with discharge instructions. Pt denies any questions or concerns at this time. Pt verbalizes understanding for follow up care and d/c.  VSS.  Pt left department with all belongings.  

## 2020-08-18 NOTE — ED Notes (Signed)
Pt provided with new sheet and blankets

## 2020-08-19 ENCOUNTER — Encounter: Payer: Self-pay | Admitting: Intensive Care

## 2020-08-19 ENCOUNTER — Emergency Department
Admission: EM | Admit: 2020-08-19 | Discharge: 2020-08-19 | Disposition: A | Discharge status: Home / Self Care | Payer: No Typology Code available for payment source | Attending: Emergency Medicine | Admitting: Emergency Medicine

## 2020-08-19 ENCOUNTER — Other Ambulatory Visit: Payer: Self-pay

## 2020-08-19 ENCOUNTER — Other Ambulatory Visit: Payer: Self-pay | Admitting: Internal Medicine

## 2020-08-19 DIAGNOSIS — E039 Hypothyroidism, unspecified: Secondary | ICD-10-CM | POA: Insufficient documentation

## 2020-08-19 DIAGNOSIS — M5442 Lumbago with sciatica, left side: Secondary | ICD-10-CM | POA: Insufficient documentation

## 2020-08-19 DIAGNOSIS — N189 Chronic kidney disease, unspecified: Secondary | ICD-10-CM | POA: Insufficient documentation

## 2020-08-19 DIAGNOSIS — Z96653 Presence of artificial knee joint, bilateral: Secondary | ICD-10-CM | POA: Diagnosis not present

## 2020-08-19 DIAGNOSIS — I129 Hypertensive chronic kidney disease with stage 1 through stage 4 chronic kidney disease, or unspecified chronic kidney disease: Secondary | ICD-10-CM | POA: Diagnosis not present

## 2020-08-19 DIAGNOSIS — M5432 Sciatica, left side: Secondary | ICD-10-CM

## 2020-08-19 DIAGNOSIS — Z7901 Long term (current) use of anticoagulants: Secondary | ICD-10-CM | POA: Insufficient documentation

## 2020-08-19 DIAGNOSIS — Z79899 Other long term (current) drug therapy: Secondary | ICD-10-CM | POA: Insufficient documentation

## 2020-08-19 MED ORDER — HYDROMORPHONE HCL 2 MG PO TABS
ORAL_TABLET | ORAL | 0 refills | Status: DC
  Filled 2020-08-19: qty 30, 7d supply, fill #0

## 2020-08-19 MED ORDER — PREDNISONE 20 MG PO TABS
60.0000 mg | ORAL_TABLET | Freq: Once | ORAL | Status: AC
Start: 2020-08-19 — End: 2020-08-19
  Administered 2020-08-19: 60 mg via ORAL
  Filled 2020-08-19: qty 3

## 2020-08-19 MED ORDER — PREDNISONE 10 MG PO TABS
ORAL_TABLET | ORAL | 0 refills | Status: DC
  Filled 2020-08-19: qty 15, 5d supply, fill #0

## 2020-08-19 MED ORDER — HYDROMORPHONE HCL 1 MG/ML IJ SOLN
1.0000 mg | Freq: Once | INTRAMUSCULAR | Status: AC
  Administered 2020-08-19: 1 mg via INTRAMUSCULAR
  Filled 2020-08-19: qty 1

## 2020-08-19 NOTE — ED Provider Notes (Signed)
[facility] Emergency Department Provider Note   ____________________________________________   Event Date/Time   First MD Initiated Contact with Patient 08/19/20 1352     (approximate)  I have reviewed the triage vital signs and the nursing notes.   HISTORY  Chief Complaint Sciatica   HPI Sara Hebert is a 60 y.o. female presents to the ED with complaint of left leg sciatica.  Patient states that she was seen in the emergency department yesterday for and was given an injection of Dilaudid and told to follow-up with neurosurgery.  Patient states that she went by the office this morning and they are unable to see her until next week.  Patient states that the pain is at this time so bad that she cannot stand it.  She is not given a prescription to take on her visit yesterday.  She has been in physical therapy today due to her left knee surgery.  Her last Dilaudid tablet was at 9 AM.  Currently she rates her pain as a 10/10.      Past Medical History:  Diagnosis Date  . Anxiety   . Arthritis   . Chronic kidney disease    early stages. creatnine recently was normal  . Depression   . GERD (gastroesophageal reflux disease)   . Hypertension   . Hypothyroidism     Patient Active Problem List   Diagnosis Date Noted  . S/P TKR (total knee replacement) using cement, left 08/01/2020  . Status post total knee replacement using cement, right 08/03/2019    Past Surgical History:  Procedure Laterality Date  . ABDOMINAL HYSTERECTOMY    . BREAST BIOPSY  1997   neg  . CHOLECYSTECTOMY    . JOINT REPLACEMENT    . TOTAL KNEE ARTHROPLASTY Right 08/03/2019   Procedure: RIGHT TOTAL KNEE ARTHROPLASTY;  Surgeon: Kennedy Bucker, MD;  Location: [facility] ORS;  Service: Orthopedics;  Laterality: Right;  . TOTAL KNEE ARTHROPLASTY Left 08/01/2020   Procedure: TOTAL KNEE ARTHROPLASTY - [place] Neighbor to Assist;  Surgeon: Kennedy Bucker, MD;  Location: [facility] ORS;  Service:  Orthopedics;  Laterality: Left;    Prior to Admission medications   Medication Sig Start Date End Date Taking? Authorizing Provider  predniSONE (DELTASONE) 10 MG tablet Take 5 tablets tomorrow, on day 2 take 4 tablets, day 3 take 3 tablets, day 4 take 2 tablets, day 5 take  1 tablet 08/19/20  Yes Bridget Hartshorn L, PA-C  acetaminophen (TYLENOL) 325 MG tablet Take 1-2 tablets (325-650 mg total) by mouth every 6 (six) hours as needed for mild pain (pain score 1-3 or temp > 100.5). 08/02/20   Evon Slack, PA-C  apixaban (ELIQUIS) 5 MG TABS tablet Take 2 tablets (10mg ) twice daily for 7 days, then 1 tablet (5mg ) twice daily Patient not taking: Reported on 07/10/2020 08/11/19   09/09/2020, MD  apixaban (ELIQUIS) 5 MG TABS tablet Take 1 tablet (5 mg total) by mouth 2 (two) times daily. 08/03/20   Sharyn Creamer, MD  buPROPion (WELLBUTRIN XL) 300 MG 24 hr tablet TAKE 1 TABLET BY MOUTH ONCE DAILY FOR MOOD 06/12/20 06/12/21  08/10/20, MD  citalopram (CELEXA) 40 MG tablet TAKE 1 TABLET BY MOUTH ONCE DAILY FOR DEPRESSION 06/12/20 06/12/21  08/10/20, MD  colchicine 0.6 MG tablet Take 0.6 mg by mouth daily as needed (gout flares).    [provider]  colchicine 0.6 MG tablet TAKE 1 TABLET BY MOUTH TWICE DAILY FOR 1 WEEK, THEN  1 TABLET ONCE A DAY 07/07/19 07/06/20  Enid Baas, MD  diphenhydrAMINE (BENADRYL) 50 MG capsule Take 50 mg by mouth every 6 (six) hours as needed for itching or sleep.    [provider]  diphenhydramine-acetaminophen (TYLENOL PM) 25-500 MG TABS tablet Take 2 tablets by mouth at bedtime as needed (pain/sleep.).    [provider]  docusate sodium (COLACE) 100 MG capsule Take 1 capsule (100 mg total) by mouth 2 (two) times daily. Patient taking differently: Take 100 mg by mouth daily as needed for mild constipation or moderate constipation. 08/05/19   Evon Slack, PA-C  HYDROmorphone (DILAUDID) 2 MG tablet Take 1 tablet (2 mg total) by mouth every 4  (four) hours as needed for moderate pain or severe pain. 08/02/20   Evon Slack, PA-C  HYDROmorphone (DILAUDID) 2 MG tablet Take 1 tablet (2 mg total) by mouth every 6 (six) hours as needed for Pain for up to 5 days 08/19/20     Insulin Pen Needle 31G X 5 MM MISC USE AS DIRECTED WITH SAXENDA 06/04/20 06/04/21  Enid Baas, MD  levothyroxine (SYNTHROID) 150 MCG tablet TAKE 1 TABLET (150 MCG TOTAL) BY MOUTH ONCE DAILY 03/04/20 03/04/21  Enid Baas, MD  Liraglutide -Weight Management 18 MG/3ML SOPN INJECT 1.8 MG SUBCUTANEOUSLY ONCE DAILY FOR 60 DAYS 03/25/20 03/25/21  Enid Baas, MD  lisinopril (ZESTRIL) 5 MG tablet TAKE 1 TABLET BY MOUTH ONCE DAILY 11/08/19 11/07/20  Enid Baas, MD  SAXENDA 18 MG/3ML SOPN Inject 3 mg into the skin daily. 05/07/20   [provider]  sennosides-docusate sodium (SENOKOT-S) 8.6-50 MG tablet Take 2 tablets by mouth daily. 08/02/20   Evon Slack, PA-C  Vitamin D, Ergocalciferol, (DRISDOL) 1.25 MG (50000 UNIT) CAPS capsule TAKE 1 CAPSULE BY MOUTH ONCE A WEEK 07/10/20 07/10/21  Enid Baas, MD  enoxaparin (LOVENOX) 40 MG/0.4ML injection Inject 0.4 mLs (40 mg total) into the skin daily for 14 days. 08/05/19 08/11/19  Evon Slack, PA-C    Allergies Patient has no known allergies.  Family History  Problem Relation Age of Onset  . Kidney failure Mother   . Breast cancer Mother 13  . Healthy Father     Social History Social History   Tobacco Use  . Smoking status: Never Smoker  . Smokeless tobacco: Never Used  Vaping Use  . Vaping Use: Never used  Substance Use Topics  . Alcohol use: Not Currently    Comment: occasional mixed drink. 6 in a year!!  . Drug use: Never    Review of Systems Constitutional: No fever/chills Eyes: No visual changes. ENT: No complaints. Cardiovascular: Denies chest pain. Respiratory: Denies shortness of breath.  Negative for cough. Gastrointestinal: No abdominal pain.  No nausea, no vomiting.   Genitourinary: Negative for dysuria. Musculoskeletal: Positive for left lower back pain with radiation to the left knee. Skin: Scar from previous knee surgery on the left knee. Neurological: Negative for headaches, focal weakness or numbness.  ____________________________________________   PHYSICAL EXAM:  VITAL SIGNS: ED Triage Vitals  Enc Vitals Group     BP 08/19/20 1344 (!) 158/67     Pulse Rate 08/19/20 1344 97     Resp 08/19/20 1344 (!) 22     Temp 08/19/20 1344 98.9 F (37.2 C)     Temp Source 08/19/20 1344 Oral     SpO2 08/19/20 1344 100 %     Weight 08/19/20 1345 207 lb (93.9 kg)     Height 08/19/20 1345  5\' 5"  (1.651 m)     Head Circumference --      Peak Flow --      Pain Score 08/19/20 1344 10     Pain Loc --      Pain Edu? --      Excl. in GC? --     Constitutional: Alert and oriented. Well appearing and in no acute distress. Eyes: Conjunctivae are normal.  Head: Atraumatic. Neck: No stridor.   Cardiovascular: Normal rate, regular rhythm. Grossly normal heart sounds.  Good peripheral circulation. Respiratory: Normal respiratory effort.  No retractions. Lungs CTAB. Gastrointestinal: Soft and nontender. No distention. Musculoskeletal: Examination of the back there is no gross deformity however there is tenderness on palpation of the lower lumbar spine and left SI joint area and surrounding tissue.  Range of motion is slow and guarded secondary to discomfort.  Straight leg raises are positive on the left.  Patient has good muscle strength bilaterally 5/5.  Pulses are present bilaterally both DP and TP. Neurologic:  Normal speech and language. No gross focal neurologic deficits are appreciated. No gait instability. Skin:  Skin is warm, dry and intact. No rash noted. Psychiatric: Mood and affect are normal. Speech and behavior are normal.  ____________________________________________   LABS (all labs ordered are listed, but only abnormal results are  displayed)  Labs Reviewed - No data to display ____________________________________________   PROCEDURES  Procedure(s) performed (including Critical Care):  Procedures   ____________________________________________   INITIAL IMPRESSION / ASSESSMENT AND PLAN / ED COURSE  As part of my medical decision making, I reviewed the following data within the electronic MEDICAL RECORD NUMBER Notes from prior ED visits and [place] Controlled Substance Database  60 year old female presents to the ED with complaint of left-sided sciatica flareup.  Patient was seen in the emergency department yesterday which time she was given Dilaudid which helped her pain however this is returned and she was able to get an appointment with the neurosurgeon for Monday but reports that she cannot deal with this pain until next week.  Patient was given Dilaudid IM along with prednisone 60 mg p.o.  Patient improved during the course of her stay and no additional imaging was done.  Patient was able to sleep due to pain relief.  We discussed the progression of her sciatica and that it is important for her to keep her appointment with the neurosurgeon.  Patient reports that she has Dilaudid at home that she can continue as needed for pain.  A tapering dose of prednisone was sent to her pharmacy.  She also is encouraged to use ice or heat to her back as needed and also elevate her knees when sleeping at night if she sleeps on her back with 2 pillows and if she has slight sleep for 2 use 1 pillow between her knees.  ____________________________________________   FINAL CLINICAL IMPRESSION(S) / ED DIAGNOSES  Final diagnoses:  Sciatica of left side     ED Discharge Orders         Ordered    predniSONE (DELTASONE) 10 MG tablet        08/19/20 1538          *Please note:  Patriece Jaydyn Menon was evaluated in Emergency Department on 08/19/2020 for the symptoms described in the history of present illness. She was evaluated in  the context of the global COVID-19 pandemic, which necessitated consideration that the patient might be at risk for infection with the SARS-CoV-2 virus that causes  COVID-19. Institutional protocols and algorithms that pertain to the evaluation of patients at risk for COVID-19 are in a state of rapid change based on information released by regulatory bodies including the CDC and federal and state organizations. These policies and algorithms were followed during the patient's care in the ED.  Some ED evaluations and interventions may be delayed as a result of limited staffing during and the pandemic.*   Note:  This document was prepared using Dragon voice recognition software and may include unintentional dictation errors.    Tommi RumpsSummers, Lenell Mcconnell L, PA-C 08/19/20 1637    Delton PrairieSmith, Dylan, MD 08/19/20 [phone]

## 2020-08-19 NOTE — Discharge Instructions (Addendum)
Keep your appointment with the neurosurgeon on Monday.  Continue with medication as prescribed.  Also you may use ice or heat to your back as needed for discomfort.  Consider using the pillows that we talked about with if you are lying on your back to sleep put 2 pillows under your knees and if lying on your side put one pillow between your knees.  This may give you some support and help you sleep at night.

## 2020-08-19 NOTE — ED Triage Notes (Signed)
Patient c/o left sided sciatica flare up. Recent left knee surgery August 01, 2020

## 2020-08-23 ENCOUNTER — Other Ambulatory Visit: Payer: Self-pay

## 2020-08-27 ENCOUNTER — Other Ambulatory Visit: Payer: Self-pay

## 2020-08-28 ENCOUNTER — Other Ambulatory Visit: Payer: Self-pay

## 2020-08-28 MED ORDER — SAXENDA 18 MG/3ML ~~LOC~~ SOPN
PEN_INJECTOR | SUBCUTANEOUS | 3 refills | Status: DC
  Filled 2020-08-28: qty 15, 50d supply, fill #0
  Filled 2020-10-24: qty 15, 30d supply, fill #1
  Filled 2020-11-25: qty 15, 30d supply, fill #2
  Filled 2020-12-25: qty 15, 30d supply, fill #3

## 2020-08-30 ENCOUNTER — Other Ambulatory Visit: Payer: Self-pay | Admitting: Internal Medicine

## 2020-08-30 DIAGNOSIS — N2889 Other specified disorders of kidney and ureter: Secondary | ICD-10-CM

## 2020-09-03 ENCOUNTER — Encounter: Payer: Self-pay | Admitting: *Deleted

## 2020-09-03 ENCOUNTER — Other Ambulatory Visit: Payer: Self-pay | Admitting: *Deleted

## 2020-09-03 NOTE — Patient Outreach (Addendum)
[facility]) Care Management  09/03/2020  Sara Hebert 1960/10/25 [id number]  Transition of care call/case closure   Referral received:07/22/20 Initial outreach: 08/05/20 Insurance: [place] [employer]   Subjective: Initial successful telephone call to patient's preferred number in order to complete transition of care assessment; 2 HIPAA identifiers verified. Explained purpose of call and completed transition of care assessment.  Sara Hebert states that she is doing pretty good, just attended a  outpatient physical therapy session this morning. She  denies post-operative problems, says surgical incisions are unremarkable, states surgical pain well managed with prescribed medications, tolerating diet, denies bowel or bladder problems.  Her father and family are assisting with her recovery. She reports progressing well with mobility, gait steady no requiring use of walker or cane. She expressed appreciation of care received during admission.    Reviewed accessing the following [place] Benefits : She reports that she does  have the hospital indemnity and has filed claim  She  uses a [facility] outpatient pharmacy at [facility]   Objective:   Sara Hebert hospitalized [facility] from3/31-4/2/22LeftTotal Knee Arthroplasty . PMHXinclude: Right total knee replacement.  Shewas discharged to home on 08/03/20 with plans noted by inpatient [facility] team for Dr. Laurence Ferrari setup outpatient physical therapy orders on Monday, 08/05/20. Assessment:  Patient voices good understanding of all discharge instructions.  See transition of care flowsheet for assessment details.   Plan:  Reviewed hospital discharge diagnosis of Left TKA  and discharge treatment plan using hospital discharge instructions, assessing medication adherence, reviewing problems requiring provider notification, and discussing the importance of follow up with surgeon, primary care  provider and/or specialists as directed.  Reviewed [place] healthy lifestyle program information to receive discounted premium for  2023   Step 1: Get  your annual physical  Step 2: Complete your health assessment  Step 3:Identify your current health status and complete the corresponding action step between May 04, 2020 and January 02, 2021.     No ongoing care management needs identified so will close case to [facility] Care Management services. Thanked patient for their services to [facility].  [name], RN, BSN  [facility] Care Management,Care Management Coordinator  [phone]- Mobile [phone]- Toll Free Main Office

## 2020-09-04 ENCOUNTER — Emergency Department
Admission: EM | Admit: 2020-09-04 | Discharge: 2020-09-04 | Disposition: A | Discharge status: Home / Self Care | Payer: No Typology Code available for payment source | Attending: Emergency Medicine | Admitting: Emergency Medicine

## 2020-09-04 ENCOUNTER — Other Ambulatory Visit: Payer: Self-pay

## 2020-09-04 ENCOUNTER — Encounter: Payer: Self-pay | Admitting: Emergency Medicine

## 2020-09-04 ENCOUNTER — Emergency Department: Payer: No Typology Code available for payment source

## 2020-09-04 DIAGNOSIS — R112 Nausea with vomiting, unspecified: Secondary | ICD-10-CM | POA: Insufficient documentation

## 2020-09-04 DIAGNOSIS — Z7901 Long term (current) use of anticoagulants: Secondary | ICD-10-CM | POA: Insufficient documentation

## 2020-09-04 DIAGNOSIS — R197 Diarrhea, unspecified: Secondary | ICD-10-CM | POA: Diagnosis not present

## 2020-09-04 DIAGNOSIS — R1013 Epigastric pain: Secondary | ICD-10-CM | POA: Diagnosis not present

## 2020-09-04 DIAGNOSIS — N189 Chronic kidney disease, unspecified: Secondary | ICD-10-CM | POA: Diagnosis not present

## 2020-09-04 DIAGNOSIS — I129 Hypertensive chronic kidney disease with stage 1 through stage 4 chronic kidney disease, or unspecified chronic kidney disease: Secondary | ICD-10-CM | POA: Insufficient documentation

## 2020-09-04 DIAGNOSIS — E039 Hypothyroidism, unspecified: Secondary | ICD-10-CM | POA: Insufficient documentation

## 2020-09-04 DIAGNOSIS — Z96653 Presence of artificial knee joint, bilateral: Secondary | ICD-10-CM | POA: Insufficient documentation

## 2020-09-04 DIAGNOSIS — Z79899 Other long term (current) drug therapy: Secondary | ICD-10-CM | POA: Insufficient documentation

## 2020-09-04 LAB — CBC
HCT: 38.5 % (ref 36.0–46.0)
Hemoglobin: 13.1 g/dL (ref 12.0–15.0)
MCH: 31.4 pg (ref 26.0–34.0)
MCHC: 34 g/dL (ref 30.0–36.0)
MCV: 92.3 fL (ref 80.0–100.0)
Platelets: 247 10*3/uL (ref 150–400)
RBC: 4.17 MIL/uL (ref 3.87–5.11)
RDW: 13.6 % (ref 11.5–15.5)
WBC: 6.8 10*3/uL (ref 4.0–10.5)
nRBC: 0 % (ref 0.0–0.2)

## 2020-09-04 LAB — URINALYSIS, COMPLETE (UACMP) WITH MICROSCOPIC
Bilirubin Urine: NEGATIVE
Glucose, UA: NEGATIVE mg/dL
Ketones, ur: 5 mg/dL — AB
Leukocytes,Ua: NEGATIVE
Nitrite: NEGATIVE
Protein, ur: 100 mg/dL — AB
Specific Gravity, Urine: 1.027 (ref 1.005–1.030)
pH: 6 (ref 5.0–8.0)

## 2020-09-04 LAB — COMPREHENSIVE METABOLIC PANEL
ALT: 23 U/L (ref 0–44)
AST: 28 U/L (ref 15–41)
Albumin: 4.7 g/dL (ref 3.5–5.0)
Alkaline Phosphatase: 115 U/L (ref 38–126)
Anion gap: 12 (ref 5–15)
BUN: 12 mg/dL (ref 6–20)
CO2: 23 mmol/L (ref 22–32)
Calcium: 9.6 mg/dL (ref 8.9–10.3)
Chloride: 102 mmol/L (ref 98–111)
Creatinine, Ser: 1.19 mg/dL — ABNORMAL HIGH (ref 0.44–1.00)
GFR, Estimated: 53 mL/min — ABNORMAL LOW (ref >60)
Glucose, Bld: 159 mg/dL — ABNORMAL HIGH (ref 70–99)
Potassium: 4.1 mmol/L (ref 3.5–5.1)
Sodium: 137 mmol/L (ref 135–145)
Total Bilirubin: 1.6 mg/dL — ABNORMAL HIGH (ref 0.3–1.2)
Total Protein: 8 g/dL (ref 6.5–8.1)

## 2020-09-04 LAB — LIPASE, BLOOD: Lipase: 31 U/L (ref 11–51)

## 2020-09-04 MED ORDER — ONDANSETRON 4 MG PO TBDP
4.0000 mg | ORAL_TABLET | Freq: Three times a day (TID) | ORAL | 0 refills | Status: DC | PRN
  Filled 2020-09-04: qty 20, 7d supply, fill #0

## 2020-09-04 MED ORDER — ONDANSETRON 4 MG PO TBDP
4.0000 mg | ORAL_TABLET | Freq: Once | ORAL | Status: AC
  Administered 2020-09-04: 4 mg via ORAL
  Filled 2020-09-04: qty 1

## 2020-09-04 MED ORDER — PROCHLORPERAZINE EDISYLATE 10 MG/2ML IJ SOLN
10.0000 mg | Freq: Once | INTRAMUSCULAR | Status: AC
  Administered 2020-09-04: 10 mg via INTRAVENOUS
  Filled 2020-09-04: qty 2

## 2020-09-04 MED ORDER — SODIUM CHLORIDE 0.9 % IV BOLUS
1000.0000 mL | Freq: Once | INTRAVENOUS | Status: AC
  Administered 2020-09-04: 1000 mL via INTRAVENOUS

## 2020-09-04 MED ORDER — IOHEXOL 300 MG/ML  SOLN
100.0000 mL | Freq: Once | INTRAMUSCULAR | Status: AC | PRN
  Administered 2020-09-04: 100 mL via INTRAVENOUS

## 2020-09-04 MED ORDER — DROPERIDOL 2.5 MG/ML IJ SOLN
2.5000 mg | Freq: Once | INTRAMUSCULAR | Status: AC
  Administered 2020-09-04: 2.5 mg via INTRAVENOUS
  Filled 2020-09-04: qty 2

## 2020-09-04 MED ORDER — SODIUM CHLORIDE 0.9 % IV SOLN
12.5000 mg | Freq: Four times a day (QID) | INTRAVENOUS | Status: DC | PRN
  Administered 2020-09-04: 12.5 mg via INTRAVENOUS
  Filled 2020-09-04 (×2): qty 0.5

## 2020-09-04 MED ORDER — ONDANSETRON 4 MG PO TBDP: 4.0000 mg | ORAL_TABLET | Freq: Three times a day (TID) | ORAL | 0 refills | Status: AC | PRN

## 2020-09-04 NOTE — ED Provider Notes (Signed)
[facility] Emergency Department Provider Note   ____________________________________________   Event Date/Time   First MD Initiated Contact with Patient 09/04/20 1208     (approximate)  I have reviewed the triage vital signs and the nursing notes.   HISTORY  Chief Complaint Emesis    HPI Sara Hebert is a 60 y.o. female with past medical history of hypertension, CKD, and GERD who presents to the ED complaining of nausea and vomiting.  Patient reports that she had a chicken biscuit yesterday afternoon and shortly afterwards started feeling nauseous with upset stomach.  She woke up around 2:00 this morning with worsening nausea and has been vomiting every 30 minutes to an hour since then.  This been associated with a couple episodes of diarrhea, she denies any blood in her emesis or stool.  She has developed some soreness in her upper abdomen but she denies any fevers, dysuria, or flank pain.        Past Medical History:  Diagnosis Date  . Anxiety   . Arthritis   . Chronic kidney disease    early stages. creatnine recently was normal  . Depression   . GERD (gastroesophageal reflux disease)   . Hypertension   . Hypothyroidism     Patient Active Problem List   Diagnosis Date Noted  . S/P TKR (total knee replacement) using cement, left 08/01/2020  . Status post total knee replacement using cement, right 08/03/2019    Past Surgical History:  Procedure Laterality Date  . ABDOMINAL HYSTERECTOMY    . BREAST BIOPSY  1997   neg  . CHOLECYSTECTOMY    . JOINT REPLACEMENT    . TOTAL KNEE ARTHROPLASTY Right 08/03/2019   Procedure: RIGHT TOTAL KNEE ARTHROPLASTY;  Surgeon: Kennedy Bucker, MD;  Location: [facility] ORS;  Service: Orthopedics;  Laterality: Right;  . TOTAL KNEE ARTHROPLASTY Left 08/01/2020   Procedure: TOTAL KNEE ARTHROPLASTY - [place] Neighbor to Assist;  Surgeon: Kennedy Bucker, MD;  Location: [facility] ORS;  Service: Orthopedics;  Laterality:  Left;    Prior to Admission medications   Medication Sig Start Date End Date Taking? Authorizing Provider  acetaminophen (TYLENOL) 325 MG tablet Take 1-2 tablets (325-650 mg total) by mouth every 6 (six) hours as needed for mild pain (pain score 1-3 or temp > 100.5). 08/02/20   Evon Slack, PA-C  apixaban (ELIQUIS) 5 MG TABS tablet Take 2 tablets (10mg ) twice daily for 7 days, then 1 tablet (5mg ) twice daily Patient not taking: Reported on 07/10/2020 08/11/19   09/09/2020, MD  apixaban (ELIQUIS) 5 MG TABS tablet Take 1 tablet (5 mg total) by mouth 2 (two) times daily. 08/03/20   Sharyn Creamer, MD  buPROPion (WELLBUTRIN XL) 300 MG 24 hr tablet TAKE 1 TABLET BY MOUTH ONCE DAILY FOR MOOD 06/12/20 06/12/21  08/10/20, MD  citalopram (CELEXA) 40 MG tablet TAKE 1 TABLET BY MOUTH ONCE DAILY FOR DEPRESSION 06/12/20 06/12/21  08/10/20, MD  colchicine 0.6 MG tablet Take 0.6 mg by mouth daily as needed (gout flares).    [provider]  colchicine 0.6 MG tablet TAKE 1 TABLET BY MOUTH TWICE DAILY FOR 1 WEEK, THEN 1 TABLET ONCE A DAY 07/07/19 07/06/20  09/06/19, MD  diphenhydrAMINE (BENADRYL) 50 MG capsule Take 50 mg by mouth every 6 (six) hours as needed for itching or sleep.    [provider]  diphenhydramine-acetaminophen (TYLENOL PM) 25-500 MG TABS tablet Take 2 tablets by mouth at bedtime as needed (  pain/sleep.).    [provider]  docusate sodium (COLACE) 100 MG capsule Take 1 capsule (100 mg total) by mouth 2 (two) times daily. Patient taking differently: Take 100 mg by mouth daily as needed for mild constipation or moderate constipation. 08/05/19   Evon Slack, PA-C  HYDROmorphone (DILAUDID) 2 MG tablet Take 1 tablet (2 mg total) by mouth every 4 (four) hours as needed for moderate pain or severe pain. 08/02/20   Evon Slack, PA-C  HYDROmorphone (DILAUDID) 2 MG tablet Take 1 tablet (2 mg total) by mouth every 6 (six) hours as needed for Pain for up to 5  days 08/19/20     Insulin Pen Needle 31G X 5 MM MISC USE AS DIRECTED WITH SAXENDA 06/04/20 06/04/21  Enid Baas, MD  levothyroxine (SYNTHROID) 150 MCG tablet TAKE 1 TABLET (150 MCG TOTAL) BY MOUTH ONCE DAILY 03/04/20 03/04/21  Enid Baas, MD  Liraglutide -Weight Management (SAXENDA) 18 MG/3ML SOPN INJECT 1.8 MG SUBCUTANEOUSLY ONCE DAILY FOR 60 DAYS 08/28/20     lisinopril (ZESTRIL) 5 MG tablet TAKE 1 TABLET BY MOUTH ONCE DAILY 11/08/19 11/07/20  Enid Baas, MD  predniSONE (DELTASONE) 10 MG tablet Take 5 tablets tomorrow, on day 2 take 4 tablets, day 3 take 3 tablets, day 4 take 2 tablets, day 5 take  1 tablet 08/19/20   Bridget Hartshorn L, PA-C  SAXENDA 18 MG/3ML SOPN Inject 3 mg into the skin daily. 05/07/20   [provider]  sennosides-docusate sodium (SENOKOT-S) 8.6-50 MG tablet Take 2 tablets by mouth daily. 08/02/20   Evon Slack, PA-C  Vitamin D, Ergocalciferol, (DRISDOL) 1.25 MG (50000 UNIT) CAPS capsule TAKE 1 CAPSULE BY MOUTH ONCE A WEEK 07/10/20 07/10/21  Enid Baas, MD  enoxaparin (LOVENOX) 40 MG/0.4ML injection Inject 0.4 mLs (40 mg total) into the skin daily for 14 days. 08/05/19 08/11/19  Evon Slack, PA-C    Allergies Patient has no known allergies.  Family History  Problem Relation Age of Onset  . Kidney failure Mother   . Breast cancer Mother 28  . Healthy Father     Social History Social History   Tobacco Use  . Smoking status: Never Smoker  . Smokeless tobacco: Never Used  Vaping Use  . Vaping Use: Never used  Substance Use Topics  . Alcohol use: Not Currently    Comment: occasional mixed drink. 6 in a year!!  . Drug use: Never    Review of Systems  Constitutional: No fever/chills Eyes: No visual changes. ENT: No sore throat. Cardiovascular: Denies chest pain. Respiratory: Denies shortness of breath. Gastrointestinal: Positive for abdominal pain, nausea, vomiting, and diarrhea.  No constipation. Genitourinary: Negative for  dysuria. Musculoskeletal: Negative for back pain. Skin: Negative for rash. Neurological: Negative for headaches, focal weakness or numbness.  ____________________________________________   PHYSICAL EXAM:  VITAL SIGNS: ED Triage Vitals  Enc Vitals Group     BP 09/04/20 1038 (!) 174/98     Pulse Rate 09/04/20 1038 (!) 105     Resp 09/04/20 1038 20     Temp 09/04/20 1038 98.3 F (36.8 C)     Temp Source 09/04/20 1038 Oral     SpO2 09/04/20 1038 98 %     Weight 09/04/20 1015 207 lb 0.2 oz (93.9 kg)     Height 09/04/20 1015 5\' 5"  (1.651 m)     Head Circumference --      Peak Flow --      Pain Score 09/04/20 1015 0  Pain Loc --      Pain Edu? --      Excl. in GC? --     Constitutional: Alert and oriented. Eyes: Conjunctivae are normal. Head: Atraumatic. Nose: No congestion/rhinnorhea. Mouth/Throat: Mucous membranes are moist. Neck: Normal ROM Cardiovascular: Normal rate, regular rhythm. Grossly normal heart sounds. Respiratory: Normal respiratory effort.  No retractions. Lungs CTAB. Gastrointestinal: Soft and nontender. No distention. Genitourinary: deferred Musculoskeletal: No lower extremity tenderness nor edema. Neurologic:  Normal speech and language. No gross focal neurologic deficits are appreciated. Skin:  Skin is warm, dry and intact. No rash noted. Psychiatric: Mood and affect are normal. Speech and behavior are normal.  ____________________________________________   LABS (all labs ordered are listed, but only abnormal results are displayed)  Labs Reviewed  COMPREHENSIVE METABOLIC PANEL - Abnormal; Notable for the following components:      Result Value   Glucose, Bld 159 (*)    Creatinine, Ser 1.19 (*)    Total Bilirubin 1.6 (*)    GFR, Estimated 53 (*)    All other components within normal limits  URINALYSIS, COMPLETE (UACMP) WITH MICROSCOPIC - Abnormal; Notable for the following components:   Color, Urine AMBER (*)    APPearance HAZY (*)    Hgb  urine dipstick MODERATE (*)    Ketones, ur 5 (*)    Protein, ur 100 (*)    Bacteria, UA RARE (*)    All other components within normal limits  LIPASE, BLOOD  CBC    PROCEDURES  Procedure(s) performed (including Critical Care):  Procedures  ED ECG REPORT I, Chesley Noon, the attending physician, personally viewed and interpreted this ECG.   Date: 09/04/2020  EKG Time: 14:41  Rate: 108  Rhythm: sinus tachycardia  Axis: Normal  Intervals:Borderline prolonged QT  ST&T Change: None   ____________________________________________   INITIAL IMPRESSION / ASSESSMENT AND PLAN / ED COURSE       60 year old female with past medical history of hypertension, CKD, and GERD who presents to the ED with nausea, vomiting, and diarrhea since 2:00 this morning along with some soreness in her upper abdomen.  She has no abdominal tenderness on exam and I suspect her epigastric discomfort is related to frequent retching.  She reports vomiting up the ODT Zofran she was given in triage, we will hydrate with IV fluids and treat with IV Compazine.  Labs thus far are unremarkable, renal function similar to previous with no acute electrolyte abnormality.  UA shows no signs of infection.  I suspect her symptoms are due to gastroenteritis, low suspicion for biliary colic or cholecystitis.  Patient with no relief of symptoms following IV fluids and Compazine.  She continues to complain of upper abdominal pain and we will further assess with CT scan.  We will treat her symptoms with IV droperidol and reassess following CT scan.  Patient turned over to oncoming provider pending CT results and reassessment.      ____________________________________________   FINAL CLINICAL IMPRESSION(S) / ED DIAGNOSES  Final diagnoses:  Non-intractable vomiting with nausea, unspecified vomiting type  Epigastric pain     ED Discharge Orders    None       Note:  This document was prepared using Dragon voice  recognition software and may include unintentional dictation errors.   Chesley Noon, MD 09/04/20 [phone]

## 2020-09-04 NOTE — ED Notes (Signed)
Sent ED MD message regarding pt pain

## 2020-09-04 NOTE — ED Provider Notes (Signed)
-----------------------------------------   4:38 PM on 09/04/2020 -----------------------------------------  Patient care assumed from Dr. Larinda Buttery.  Patient CT scan is negative for acute abnormality.  Patient did begin vomiting once again after CT oral contrast.  We will dose Phenergan and reassess.  If nausea is able to be controlled anticipate discharge home, if nausea is not able to be controlled patient will be admitted for intractable nausea vomiting.  Patient states she is beginning to feel better.  Wishes to try to go home with nausea medication.  We will discharge home.  Has been able to tolerate ice chips in the emergency department.   Minna Antis, MD 09/04/20 2011

## 2020-09-04 NOTE — ED Triage Notes (Signed)
C/O vomiting since 0300.  AAOx3.  Skin warm and dry. NAD

## 2020-09-04 NOTE — ED Notes (Signed)
Spoke with Pharmacy regarding Phenergan IVPB , they advised they would send

## 2020-09-04 NOTE — ED Notes (Signed)
Patient transported to CT 

## 2020-09-04 NOTE — ED Notes (Signed)
Pt tolerated PO challenge, ED MD made aware

## 2020-09-05 ENCOUNTER — Other Ambulatory Visit: Payer: Self-pay

## 2020-09-08 ENCOUNTER — Observation Stay
Admission: EM | Admit: 2020-09-08 | Discharge: 2020-09-10 | Disposition: A | Discharge status: Home / Self Care | Payer: No Typology Code available for payment source | Attending: Internal Medicine | Admitting: Internal Medicine

## 2020-09-08 DIAGNOSIS — N179 Acute kidney failure, unspecified: Secondary | ICD-10-CM | POA: Diagnosis not present

## 2020-09-08 DIAGNOSIS — Z20822 Contact with and (suspected) exposure to covid-19: Secondary | ICD-10-CM | POA: Diagnosis not present

## 2020-09-08 DIAGNOSIS — Z79899 Other long term (current) drug therapy: Secondary | ICD-10-CM | POA: Insufficient documentation

## 2020-09-08 DIAGNOSIS — R197 Diarrhea, unspecified: Secondary | ICD-10-CM | POA: Diagnosis present

## 2020-09-08 DIAGNOSIS — K529 Noninfective gastroenteritis and colitis, unspecified: Principal | ICD-10-CM | POA: Diagnosis present

## 2020-09-08 DIAGNOSIS — Z96652 Presence of left artificial knee joint: Secondary | ICD-10-CM

## 2020-09-08 DIAGNOSIS — I129 Hypertensive chronic kidney disease with stage 1 through stage 4 chronic kidney disease, or unspecified chronic kidney disease: Secondary | ICD-10-CM | POA: Insufficient documentation

## 2020-09-08 DIAGNOSIS — F32A Depression, unspecified: Secondary | ICD-10-CM

## 2020-09-08 DIAGNOSIS — E860 Dehydration: Secondary | ICD-10-CM | POA: Diagnosis not present

## 2020-09-08 DIAGNOSIS — N189 Chronic kidney disease, unspecified: Secondary | ICD-10-CM

## 2020-09-08 DIAGNOSIS — E039 Hypothyroidism, unspecified: Secondary | ICD-10-CM | POA: Diagnosis not present

## 2020-09-08 DIAGNOSIS — N1832 Chronic kidney disease, stage 3b: Secondary | ICD-10-CM | POA: Diagnosis not present

## 2020-09-08 DIAGNOSIS — Z96653 Presence of artificial knee joint, bilateral: Secondary | ICD-10-CM | POA: Insufficient documentation

## 2020-09-08 DIAGNOSIS — I10 Essential (primary) hypertension: Secondary | ICD-10-CM

## 2020-09-08 DIAGNOSIS — Z7901 Long term (current) use of anticoagulants: Secondary | ICD-10-CM | POA: Insufficient documentation

## 2020-09-08 LAB — COMPREHENSIVE METABOLIC PANEL
ALT: 21 U/L (ref 0–44)
AST: 21 U/L (ref 15–41)
Albumin: 4.6 g/dL (ref 3.5–5.0)
Alkaline Phosphatase: 113 U/L (ref 38–126)
Anion gap: 13 (ref 5–15)
BUN: 24 mg/dL — ABNORMAL HIGH (ref 6–20)
CO2: 25 mmol/L (ref 22–32)
Calcium: 9.5 mg/dL (ref 8.9–10.3)
Chloride: 96 mmol/L — ABNORMAL LOW (ref 98–111)
Creatinine, Ser: 1.86 mg/dL — ABNORMAL HIGH (ref 0.44–1.00)
GFR, Estimated: 31 mL/min — ABNORMAL LOW (ref >60)
Glucose, Bld: 118 mg/dL — ABNORMAL HIGH (ref 70–99)
Potassium: 3.5 mmol/L (ref 3.5–5.1)
Sodium: 134 mmol/L — ABNORMAL LOW (ref 135–145)
Total Bilirubin: 2.3 mg/dL — ABNORMAL HIGH (ref 0.3–1.2)
Total Protein: 7.9 g/dL (ref 6.5–8.1)

## 2020-09-08 LAB — URINALYSIS, COMPLETE (UACMP) WITH MICROSCOPIC
Bilirubin Urine: NEGATIVE
Glucose, UA: NEGATIVE mg/dL
Ketones, ur: NEGATIVE mg/dL
Leukocytes,Ua: NEGATIVE
Nitrite: NEGATIVE
Protein, ur: NEGATIVE mg/dL
Specific Gravity, Urine: 1.008 (ref 1.005–1.030)
pH: 6 (ref 5.0–8.0)

## 2020-09-08 LAB — CBC
HCT: 41.6 % (ref 36.0–46.0)
Hemoglobin: 13.9 g/dL (ref 12.0–15.0)
MCH: 30.9 pg (ref 26.0–34.0)
MCHC: 33.4 g/dL (ref 30.0–36.0)
MCV: 92.4 fL (ref 80.0–100.0)
Platelets: 294 10*3/uL (ref 150–400)
RBC: 4.5 MIL/uL (ref 3.87–5.11)
RDW: 13.5 % (ref 11.5–15.5)
WBC: 9.8 10*3/uL (ref 4.0–10.5)
nRBC: 0 % (ref 0.0–0.2)

## 2020-09-08 LAB — RESP PANEL BY RT-PCR (FLU A&B, COVID) ARPGX2
Influenza A by PCR: NEGATIVE
Influenza B by PCR: NEGATIVE
SARS Coronavirus 2 by RT PCR: NEGATIVE

## 2020-09-08 LAB — POC URINE PREG, ED: Preg Test, Ur: NEGATIVE

## 2020-09-08 LAB — LIPASE, BLOOD: Lipase: 46 U/L (ref 11–51)

## 2020-09-08 MED ORDER — ONDANSETRON HCL 4 MG/2ML IJ SOLN
4.0000 mg | Freq: Once | INTRAMUSCULAR | Status: DC
  Filled 2020-09-08: qty 2

## 2020-09-08 MED ORDER — SODIUM CHLORIDE 0.9 % IV SOLN
6.2500 mg | Freq: Four times a day (QID) | INTRAVENOUS | Status: DC | PRN
  Filled 2020-09-08 (×2): qty 0.25

## 2020-09-08 MED ORDER — LACTATED RINGERS IV SOLN: INTRAVENOUS | Status: DC

## 2020-09-08 MED ORDER — FAMOTIDINE IN NACL 20-0.9 MG/50ML-% IV SOLN
20.0000 mg | Freq: Once | INTRAVENOUS | Status: AC
  Administered 2020-09-08: 20 mg via INTRAVENOUS
  Filled 2020-09-08: qty 50

## 2020-09-08 MED ORDER — ONDANSETRON 4 MG PO TBDP
4.0000 mg | ORAL_TABLET | Freq: Once | ORAL | Status: AC | PRN
  Administered 2020-09-08: 4 mg via ORAL
  Filled 2020-09-08: qty 1

## 2020-09-08 MED ORDER — MORPHINE SULFATE (PF) 2 MG/ML IV SOLN
2.0000 mg | INTRAVENOUS | Status: DC | PRN
  Administered 2020-09-09: 2 mg via INTRAVENOUS
  Filled 2020-09-08: qty 1

## 2020-09-08 MED ORDER — SODIUM CHLORIDE 0.9 % IV BOLUS
1000.0000 mL | Freq: Once | INTRAVENOUS | Status: AC
  Administered 2020-09-08: 1000 mL via INTRAVENOUS

## 2020-09-08 MED ORDER — ACETAMINOPHEN 650 MG RE SUPP: 650.0000 mg | Freq: Four times a day (QID) | RECTAL | Status: DC | PRN

## 2020-09-08 MED ORDER — LABETALOL HCL 5 MG/ML IV SOLN: 10.0000 mg | Freq: Four times a day (QID) | INTRAVENOUS | Status: DC | PRN

## 2020-09-08 MED ORDER — ONDANSETRON HCL 4 MG PO TABS: 4.0000 mg | ORAL_TABLET | Freq: Four times a day (QID) | ORAL | Status: DC | PRN

## 2020-09-08 MED ORDER — ENOXAPARIN SODIUM 60 MG/0.6ML IJ SOSY
0.5000 mg/kg | PREFILLED_SYRINGE | INTRAMUSCULAR | Status: DC
  Administered 2020-09-09: 47.5 mg via SUBCUTANEOUS
  Filled 2020-09-08: qty 0.6

## 2020-09-08 MED ORDER — ACETAMINOPHEN 325 MG PO TABS: 650.0000 mg | ORAL_TABLET | Freq: Four times a day (QID) | ORAL | Status: DC | PRN

## 2020-09-08 MED ORDER — MORPHINE SULFATE (PF) 4 MG/ML IV SOLN
6.0000 mg | Freq: Once | INTRAVENOUS | Status: AC
Start: 2020-09-08 — End: 2020-09-08
  Administered 2020-09-08: 6 mg via INTRAVENOUS
  Filled 2020-09-08: qty 2

## 2020-09-08 MED ORDER — ONDANSETRON HCL 4 MG/2ML IJ SOLN: 4.0000 mg | Freq: Four times a day (QID) | INTRAMUSCULAR | Status: DC | PRN

## 2020-09-08 MED ORDER — PANTOPRAZOLE SODIUM 40 MG IV SOLR
40.0000 mg | INTRAVENOUS | Status: DC
  Administered 2020-09-09 (×2): 40 mg via INTRAVENOUS
  Filled 2020-09-08 (×2): qty 40

## 2020-09-08 NOTE — ED Triage Notes (Signed)
Pt reports being here Wednesday and "was diagnosed with food poisoning." Reports continued emesis and diarrhea, denies fevers but c/o chills.

## 2020-09-08 NOTE — ED Provider Notes (Signed)
[facility] Emergency Department Provider Note  ____________________________________________   Event Date/Time   First MD Initiated Contact with Patient 09/08/20 1941     (approximate)  I have reviewed the triage vital signs and the nursing notes.   HISTORY  Chief Complaint Emesis and Diarrhea    HPI Sara Hebert is a 60 y.o. female  With PMhx HTN, depression, CKD, here with nausea, vomiting, and diarrhea.  The patient was just here several days ago for similar symptoms.  She was concerned that she had food poisoning.  She had a negative CT scan at that time and was sent home with supportive care.  Since then, she has had ongoing, severe nausea and vomiting.  She has been unable to eat or drink or keep anything down.  She has had persistent diarrhea as well that is watery, dark green.  No blood.  She has had associated abdominal cramping, worse in the epigastric area.  No alleviating factors.  No urinary symptoms.  She has been essentially unable to tolerate p.o. since her prior ED visit.  She has been taking Zofran without significant relief.        Past Medical History:  Diagnosis Date  . Anxiety   . Arthritis   . Chronic kidney disease    early stages. creatnine recently was normal  . Depression   . GERD (gastroesophageal reflux disease)   . Hypertension   . Hypothyroidism     Patient Active Problem List   Diagnosis Date Noted  . Acute kidney injury superimposed on CKD llla (HCC) 09/08/2020  . Acute gastroenteritis 09/08/2020  . Hypothyroidism 09/08/2020  . Depression 09/08/2020  . HTN (hypertension) 09/08/2020  . S/P TKR (total knee replacement) using cement, left 08/01/2020  . Status post total knee replacement using cement, right 08/03/2019    Past Surgical History:  Procedure Laterality Date  . ABDOMINAL HYSTERECTOMY    . BREAST BIOPSY  1997   neg  . CHOLECYSTECTOMY    . JOINT REPLACEMENT    . TOTAL KNEE ARTHROPLASTY  Right 08/03/2019   Procedure: RIGHT TOTAL KNEE ARTHROPLASTY;  Surgeon: Kennedy Bucker, MD;  Location: [facility] ORS;  Service: Orthopedics;  Laterality: Right;  . TOTAL KNEE ARTHROPLASTY Left 08/01/2020   Procedure: TOTAL KNEE ARTHROPLASTY - [place] Neighbor to Assist;  Surgeon: Kennedy Bucker, MD;  Location: [facility] ORS;  Service: Orthopedics;  Laterality: Left;    Prior to Admission medications   Medication Sig Start Date End Date Taking? Authorizing Provider  acetaminophen (TYLENOL) 325 MG tablet Take 1-2 tablets (325-650 mg total) by mouth every 6 (six) hours as needed for mild pain (pain score 1-3 or temp > 100.5). 08/02/20  Yes Evon Slack, PA-C  apixaban (ELIQUIS) 5 MG TABS tablet Take 1 tablet (5 mg total) by mouth 2 (two) times daily. 08/03/20  Yes Kennedy Bucker, MD  buPROPion (WELLBUTRIN XL) 300 MG 24 hr tablet TAKE 1 TABLET BY MOUTH ONCE DAILY FOR MOOD 06/12/20 06/12/21 Yes Enid Baas, MD  citalopram (CELEXA) 40 MG tablet TAKE 1 TABLET BY MOUTH ONCE DAILY FOR DEPRESSION 06/12/20 06/12/21 Yes Enid Baas, MD  colchicine 0.6 MG tablet Take 0.6 mg by mouth daily as needed (gout flares).   Yes [provider]  diphenhydrAMINE (BENADRYL) 50 MG capsule Take 50 mg by mouth every 6 (six) hours as needed for itching or sleep.   Yes [provider]  diphenhydramine-acetaminophen (TYLENOL PM) 25-500 MG TABS tablet Take 2 tablets by mouth at bedtime  as needed (pain/sleep.).   Yes [provider]  docusate sodium (COLACE) 100 MG capsule Take 1 capsule (100 mg total) by mouth 2 (two) times daily. Patient taking differently: Take 100 mg by mouth daily as needed for mild constipation or moderate constipation. 08/05/19  Yes Evon Slack, PA-C  Insulin Pen Needle 31G X 5 MM MISC USE AS DIRECTED WITH SAXENDA 06/04/20 06/04/21 Yes Enid Baas, MD  levothyroxine (SYNTHROID) 150 MCG tablet TAKE 1 TABLET (150 MCG TOTAL) BY MOUTH ONCE DAILY 03/04/20 03/04/21 Yes Enid Baas, MD   Liraglutide -Weight Management (SAXENDA) 18 MG/3ML SOPN INJECT 1.8 MG SUBCUTANEOUSLY ONCE DAILY FOR 60 DAYS Patient taking differently: Inject 3 mg into the skin daily. 08/28/20  Yes   lisinopril (ZESTRIL) 5 MG tablet TAKE 1 TABLET BY MOUTH ONCE DAILY 11/08/19 11/07/20 Yes Enid Baas, MD  ondansetron (ZOFRAN ODT) 4 MG disintegrating tablet Take 1 tablet (4 mg total) by mouth every 8 (eight) hours as needed for nausea or vomiting. 09/04/20  Yes Minna Antis, MD  Vitamin D, Ergocalciferol, (DRISDOL) 1.25 MG (50000 UNIT) CAPS capsule TAKE 1 CAPSULE BY MOUTH ONCE A WEEK 07/10/20 07/10/21 Yes Enid Baas, MD  HYDROmorphone (DILAUDID) 2 MG tablet Take 1 tablet (2 mg total) by mouth every 4 (four) hours as needed for moderate pain or severe pain. Patient not taking: No sig reported 08/02/20   Evon Slack, PA-C  HYDROmorphone (DILAUDID) 2 MG tablet Take 1 tablet (2 mg total) by mouth every 6 (six) hours as needed for Pain for up to 5 days Patient not taking: No sig reported 08/19/20     predniSONE (DELTASONE) 10 MG tablet Take 5 tablets tomorrow, on day 2 take 4 tablets, day 3 take 3 tablets, day 4 take 2 tablets, day 5 take  1 tablet Patient not taking: No sig reported 08/19/20   Bridget Hartshorn L, PA-C  sennosides-docusate sodium (SENOKOT-S) 8.6-50 MG tablet Take 2 tablets by mouth daily. Patient not taking: No sig reported 08/02/20   Evon Slack, PA-C  enoxaparin (LOVENOX) 40 MG/0.4ML injection Inject 0.4 mLs (40 mg total) into the skin daily for 14 days. 08/05/19 08/11/19  Evon Slack, PA-C    Allergies Patient has no known allergies.  Family History  Problem Relation Age of Onset  . Kidney failure Mother   . Breast cancer Mother 94  . Healthy Father     Social History Social History   Tobacco Use  . Smoking status: Never Smoker  . Smokeless tobacco: Never Used  Vaping Use  . Vaping Use: Never used  Substance Use Topics  . Alcohol use: Not Currently    Comment:  occasional mixed drink. 6 in a year!!  . Drug use: Never    Review of Systems  Review of Systems  Constitutional: Positive for fatigue. Negative for fever.  HENT: Negative for congestion and sore throat.   Eyes: Negative for visual disturbance.  Respiratory: Negative for cough and shortness of breath.   Cardiovascular: Negative for chest pain.  Gastrointestinal: Positive for abdominal pain, diarrhea, nausea and vomiting.  Genitourinary: Negative for flank pain.  Musculoskeletal: Negative for back pain and neck pain.  Skin: Negative for rash and wound.  Neurological: Positive for weakness.  All other systems reviewed and are negative.    ____________________________________________  PHYSICAL EXAM:      VITAL SIGNS: ED Triage Vitals  Enc Vitals Group     BP 09/08/20 1911 (!) 149/93     Pulse Rate 09/08/20  1911 99     Resp 09/08/20 1911 16     Temp 09/08/20 1910 98.5 F (36.9 C)     Temp Source 09/08/20 1910 Oral     SpO2 09/08/20 1911 97 %     Weight 09/08/20 1910 207 lb 3.7 oz (94 kg)     Height 09/08/20 1910 5\' 5"  (1.651 m)     Head Circumference --      Peak Flow --      Pain Score 09/08/20 1910 7     Pain Loc --      Pain Edu? --      Excl. in GC? --      Physical Exam Vitals and nursing note reviewed.  Constitutional:      General: She is not in acute distress.    Appearance: She is well-developed.  HENT:     Head: Normocephalic and atraumatic.     Mouth/Throat:     Mouth: Mucous membranes are dry.  Eyes:     Conjunctiva/sclera: Conjunctivae normal.  Cardiovascular:     Rate and Rhythm: Regular rhythm. Tachycardia present.     Heart sounds: Normal heart sounds. No murmur heard. No friction rub.  Pulmonary:     Effort: Pulmonary effort is normal. No respiratory distress.     Breath sounds: Normal breath sounds. No wheezing or rales.  Abdominal:     General: There is no distension.     Palpations: Abdomen is soft.     Tenderness: There is abdominal  tenderness (mild epigastric).  Musculoskeletal:     Cervical back: Neck supple.  Skin:    General: Skin is warm.     Capillary Refill: Capillary refill takes less than 2 seconds.  Neurological:     Mental Status: She is alert and oriented to person, place, and time.     Motor: No abnormal muscle tone.       ____________________________________________   LABS (all labs ordered are listed, but only abnormal results are displayed)  Labs Reviewed  COMPREHENSIVE METABOLIC PANEL - Abnormal; Notable for the following components:      Result Value   Sodium 134 (*)    Chloride 96 (*)    Glucose, Bld 118 (*)    BUN 24 (*)    Creatinine, Ser 1.86 (*)    Total Bilirubin 2.3 (*)    GFR, Estimated 31 (*)    All other components within normal limits  URINALYSIS, COMPLETE (UACMP) WITH MICROSCOPIC - Abnormal; Notable for the following components:   Color, Urine YELLOW (*)    APPearance HAZY (*)    Hgb urine dipstick MODERATE (*)    Bacteria, UA MANY (*)    All other components within normal limits  RESP PANEL BY RT-PCR (FLU A&B, COVID) ARPGX2  GASTROINTESTINAL PANEL BY PCR, STOOL (REPLACES STOOL CULTURE)  C DIFFICILE QUICK SCREEN W PCR REFLEX  LIPASE, BLOOD  CBC  HIV ANTIBODY (ROUTINE TESTING W REFLEX)  POC URINE PREG, ED    ____________________________________________  EKG:  ________________________________________  RADIOLOGY All imaging, including plain films, CT scans, and ultrasounds, independently reviewed by me, and interpretations confirmed via formal radiology reads.  ED MD interpretation:     Official radiology report(s): No results found.  ____________________________________________  PROCEDURES   Procedure(s) performed (including Critical Care):  Procedures  ____________________________________________  INITIAL IMPRESSION / MDM / ASSESSMENT AND PLAN / ED COURSE  As part of my medical decision making, I reviewed the following data within the  electronic medical  record:  Nursing notes reviewed and incorporated, Old chart reviewed, Notes from prior ED visits, and [place] Controlled Substance Database       *Shanena Sidni Fusco was evaluated in Emergency Department on 09/08/2020 for the symptoms described in the history of present illness. She was evaluated in the context of the global COVID-19 pandemic, which necessitated consideration that the patient might be at risk for infection with the SARS-CoV-2 virus that causes COVID-19. Institutional protocols and algorithms that pertain to the evaluation of patients at risk for COVID-19 are in a state of rapid change based on information released by regulatory bodies including the CDC and federal and state organizations. These policies and algorithms were followed during the patient's care in the ED.  Some ED evaluations and interventions may be delayed as a result of limited staffing during the pandemic.*     Medical Decision Making: 60 year old female here with ongoing nausea, vomiting, and diarrhea.  Suspect ongoing viral versus foodborne illness, though at this point would consider possible bacterial foodborne illness.  GI panel has been sent.  Otherwise, she is afebrile, white count normal, and no focal tenderness to suggest need for repeat CT imaging.  Lab work shows worsening acute on chronic kidney injury.  Will admit for IV fluid and follow-up of her labs.  Patient updated and is in agreement.  ____________________________________________  FINAL CLINICAL IMPRESSION(S) / ED DIAGNOSES  Final diagnoses:  Dehydration  Acute renal failure superimposed on stage 3b chronic kidney disease, unspecified acute renal failure type (HCC)     MEDICATIONS GIVEN DURING THIS VISIT:  Medications  ondansetron (ZOFRAN) injection 4 mg (0 mg Intravenous Hold 09/08/20 2124)  enoxaparin (LOVENOX) injection 47.5 mg (has no administration in time range)  lactated ringers infusion (has no administration in  time range)  acetaminophen (TYLENOL) tablet 650 mg (has no administration in time range)    Or  acetaminophen (TYLENOL) suppository 650 mg (has no administration in time range)  morphine 2 MG/ML injection 2 mg (has no administration in time range)  ondansetron (ZOFRAN) tablet 4 mg (has no administration in time range)    Or  ondansetron (ZOFRAN) injection 4 mg (has no administration in time range)  promethazine (PHENERGAN) 6.25 mg in sodium chloride 0.9 % 50 mL IVPB (has no administration in time range)  labetalol (NORMODYNE) injection 10 mg (has no administration in time range)  pantoprazole (PROTONIX) injection 40 mg (has no administration in time range)  ondansetron (ZOFRAN-ODT) disintegrating tablet 4 mg (4 mg Oral Given 09/08/20 1920)  sodium chloride 0.9 % bolus 1,000 mL (0 mLs Intravenous Stopped 09/08/20 2224)  morphine 4 MG/ML injection 6 mg (6 mg Intravenous Given 09/08/20 2124)  famotidine (PEPCID) IVPB 20 mg premix (0 mg Intravenous Stopped 09/08/20 2224)     ED Discharge Orders    None       Note:  This document was prepared using Dragon voice recognition software and may include unintentional dictation errors.   Shaune Pollack, MD 09/08/20 2255

## 2020-09-08 NOTE — Progress Notes (Signed)
PHARMACIST - PHYSICIAN COMMUNICATION  CONCERNING:  Enoxaparin (Lovenox) for DVT Prophylaxis    RECOMMENDATION: Patient was prescribed enoxaprin 40mg  q24 hours for VTE prophylaxis.   Filed Weights   09/08/20 1910  Weight: 94 kg (207 lb 3.7 oz)    Body mass index is 34.49 kg/m.  Estimated Creatinine Clearance: 37.8 mL/min (A) (by C-G formula based on SCr of 1.86 mg/dL (H)).   Based on [facility] policy patient is candidate for enoxaparin 0.5mg /kg TBW SQ every 24 hours based on BMI being >30.  DESCRIPTION: Pharmacy has adjusted enoxaparin dose per [facility] policy.  Patient is now receiving enoxaparin 0.5 mg/kg every 24 hours   [facility], PharmD, [facility] 09/08/2020 10:25 PM

## 2020-09-08 NOTE — H&P (Signed)
History and Physical    Tascha Casares OEV:[id number] DOB: 1960-11-07 DOA: 09/08/2020  PCP: [facility]   Patient coming from: Home  I have personally briefly reviewed patient's old medical records in [facility]  Chief Complaint: Vomiting and diarrhea  HPI: Sara Hebert is a 60 y.o. female with medical history significant for HTN, hypothyroidism, CKD stage IIIa with status post recent total knee arthroplasty on 3/31 and on apixaban until  3 months post op  Due to history of RLE DVT after R TKA in 08/2019, who presents to the emergency room for the second time in 4 days with intractable vomiting and diarrhea that started after eating a biscuit from a fast food restaurant.  Patient was seen in the ER on 5/4 and treated for acute gastroenteritis and discharged.  She had an abdominal CT that showed no acute findings.  Today she returns with continued vomiting, diarrhea and poor oral intake to where she is unable to hold down any food and medication.  Vomiting is nonbloody and nonbilious.  She is status postcholecystectomy and no biliary dilatation was seen.  Patient denies fever and chills.  Has mild epigastric pain.  Denies cough, chest pain or shortness of breath ED course: On arrival, afebrile, BP 149/93, pulse 99, respirations 16 with O2 sat 97% on room air.  CBC WNL, CMP with creatinine of 1.86 up from baseline of around 1.3.  Lipase normal at 46.  Urinalysis with many bacteria, otherwise unremarkable Imaging: CT abdomen and pelvis from 09/04/2020 showed no acute intra-abdominal or intrapelvic process.  Status postcholecystectomy.  Imaging not repeated  Patient treated with IV hydration and IV antiemetics.  Stool studies sent.  Hospitalist consulted for admission.  Review of Systems: As per HPI otherwise all other systems on review of systems negative.    Past Medical History:  Diagnosis Date  . Anxiety   . Arthritis   . Chronic kidney disease    early  stages. creatnine recently was normal  . Depression   . GERD (gastroesophageal reflux disease)   . Hypertension   . Hypothyroidism     Past Surgical History:  Procedure Laterality Date  . ABDOMINAL HYSTERECTOMY    . BREAST BIOPSY  1997   neg  . CHOLECYSTECTOMY    . JOINT REPLACEMENT    . TOTAL KNEE ARTHROPLASTY Right 08/03/2019   Procedure: RIGHT TOTAL KNEE ARTHROPLASTY;  Surgeon: Kennedy Bucker, MD;  Location: [facility] ORS;  Service: Orthopedics;  Laterality: Right;  . TOTAL KNEE ARTHROPLASTY Left 08/01/2020   Procedure: TOTAL KNEE ARTHROPLASTY - [place] Neighbor to Assist;  Surgeon: Kennedy Bucker, MD;  Location: [facility] ORS;  Service: Orthopedics;  Laterality: Left;     reports that she has never smoked. She has never used smokeless tobacco. She reports previous alcohol use. She reports that she does not use drugs.  No Known Allergies  Family History  Problem Relation Age of Onset  . Kidney failure Mother   . Breast cancer Mother 54  . Healthy Father       Prior to Admission medications   Medication Sig Start Date End Date Taking? Authorizing Provider  acetaminophen (TYLENOL) 325 MG tablet Take 1-2 tablets (325-650 mg total) by mouth every 6 (six) hours as needed for mild pain (pain score 1-3 or temp > 100.5). 08/02/20  Yes Evon Slack, PA-C  apixaban (ELIQUIS) 5 MG TABS tablet Take 1 tablet (5 mg total) by mouth 2 (two) times daily. 08/03/20  Yes Kennedy Bucker,  MD  buPROPion (WELLBUTRIN XL) 300 MG 24 hr tablet TAKE 1 TABLET BY MOUTH ONCE DAILY FOR MOOD 06/12/20 06/12/21 Yes Enid Baas, MD  citalopram (CELEXA) 40 MG tablet TAKE 1 TABLET BY MOUTH ONCE DAILY FOR DEPRESSION 06/12/20 06/12/21 Yes Enid Baas, MD  colchicine 0.6 MG tablet Take 0.6 mg by mouth daily as needed (gout flares).   Yes [provider]  diphenhydrAMINE (BENADRYL) 50 MG capsule Take 50 mg by mouth every 6 (six) hours as needed for itching or sleep.   Yes [provider]   diphenhydramine-acetaminophen (TYLENOL PM) 25-500 MG TABS tablet Take 2 tablets by mouth at bedtime as needed (pain/sleep.).   Yes [provider]  docusate sodium (COLACE) 100 MG capsule Take 1 capsule (100 mg total) by mouth 2 (two) times daily. Patient taking differently: Take 100 mg by mouth daily as needed for mild constipation or moderate constipation. 08/05/19  Yes Evon Slack, PA-C  Insulin Pen Needle 31G X 5 MM MISC USE AS DIRECTED WITH SAXENDA 06/04/20 06/04/21 Yes Enid Baas, MD  levothyroxine (SYNTHROID) 150 MCG tablet TAKE 1 TABLET (150 MCG TOTAL) BY MOUTH ONCE DAILY 03/04/20 03/04/21 Yes Enid Baas, MD  Liraglutide -Weight Management (SAXENDA) 18 MG/3ML SOPN INJECT 1.8 MG SUBCUTANEOUSLY ONCE DAILY FOR 60 DAYS Patient taking differently: Inject 3 mg into the skin daily. 08/28/20  Yes   lisinopril (ZESTRIL) 5 MG tablet TAKE 1 TABLET BY MOUTH ONCE DAILY 11/08/19 11/07/20 Yes Enid Baas, MD  ondansetron (ZOFRAN ODT) 4 MG disintegrating tablet Take 1 tablet (4 mg total) by mouth every 8 (eight) hours as needed for nausea or vomiting. 09/04/20  Yes Minna Antis, MD  Vitamin D, Ergocalciferol, (DRISDOL) 1.25 MG (50000 UNIT) CAPS capsule TAKE 1 CAPSULE BY MOUTH ONCE A WEEK 07/10/20 07/10/21 Yes Enid Baas, MD  HYDROmorphone (DILAUDID) 2 MG tablet Take 1 tablet (2 mg total) by mouth every 4 (four) hours as needed for moderate pain or severe pain. Patient not taking: No sig reported 08/02/20   Evon Slack, PA-C  HYDROmorphone (DILAUDID) 2 MG tablet Take 1 tablet (2 mg total) by mouth every 6 (six) hours as needed for Pain for up to 5 days Patient not taking: No sig reported 08/19/20     predniSONE (DELTASONE) 10 MG tablet Take 5 tablets tomorrow, on day 2 take 4 tablets, day 3 take 3 tablets, day 4 take 2 tablets, day 5 take  1 tablet Patient not taking: No sig reported 08/19/20   Bridget Hartshorn L, PA-C  sennosides-docusate sodium (SENOKOT-S) 8.6-50 MG tablet  Take 2 tablets by mouth daily. Patient not taking: No sig reported 08/02/20   Evon Slack, PA-C  enoxaparin (LOVENOX) 40 MG/0.4ML injection Inject 0.4 mLs (40 mg total) into the skin daily for 14 days. 08/05/19 08/11/19  Evon Slack, PA-C    Physical Exam: Vitals:   09/08/20 2030 09/08/20 2045 09/08/20 2100 09/08/20 2115  BP: 131/78 (!) 146/82 (!) 156/80 (!) 152/77  Pulse: 90 89 87 87  Resp:      Temp:      TempSrc:      SpO2: 100% 100% 100% 100%  Weight:      Height:         Vitals:   09/08/20 2030 09/08/20 2045 09/08/20 2100 09/08/20 2115  BP: 131/78 (!) 146/82 (!) 156/80 (!) 152/77  Pulse: 90 89 87 87  Resp:      Temp:      TempSrc:  SpO2: 100% 100% 100% 100%  Weight:      Height:          Constitutional: Alert and oriented x 3 . Not in any apparent distress HEENT:      Head: Normocephalic and atraumatic.         Eyes: PERLA, EOMI, Conjunctivae are normal. Sclera is non-icteric.       Mouth/Throat: Mucous membranes are moist.       Neck: Supple with no signs of meningismus. Cardiovascular: Regular rate and rhythm. No murmurs, gallops, or rubs. 2+ symmetrical distal pulses are present . No JVD. No LE edema Respiratory: Respiratory effort normal .Lungs sounds clear bilaterally. No wheezes, crackles, or rhonchi.  Gastrointestinal: Soft, non tender, and non distended with positive bowel sounds.  Genitourinary: No CVA tenderness. Musculoskeletal: Nontender with normal range of motion in all extremities. No cyanosis, or erythema of extremities. Neurologic:  Face is symmetric. Moving all extremities. No gross focal neurologic deficits . Skin: Skin is warm, dry.  No rash or ulcers Psychiatric: Mood and affect are normal    Labs on Admission: I have personally reviewed following labs and imaging studies  CBC: Recent Labs  Lab 09/04/20 1042 09/08/20 1914  WBC 6.8 9.8  HGB 13.1 13.9  HCT 38.5 41.6  MCV 92.3 92.4  PLT 247 294   Basic Metabolic  Panel: Recent Labs  Lab 09/04/20 1042 09/08/20 1914  NA 137 134*  K 4.1 3.5  CL 102 96*  CO2 23 25  GLUCOSE 159* 118*  BUN 12 24*  CREATININE 1.19* 1.86*  CALCIUM 9.6 9.5   GFR: Estimated Creatinine Clearance: 37.8 mL/min (A) (by C-G formula based on SCr of 1.86 mg/dL (H)). Liver Function Tests: Recent Labs  Lab 09/04/20 1042 09/08/20 1914  AST 28 21  ALT 23 21  ALKPHOS 115 113  BILITOT 1.6* 2.3*  PROT 8.0 7.9  ALBUMIN 4.7 4.6   Recent Labs  Lab 09/04/20 1042 09/08/20 1914  LIPASE 31 46   No results for input(s): AMMONIA in the last 168 hours. Coagulation Profile: No results for input(s): INR, PROTIME in the last 168 hours. Cardiac Enzymes: No results for input(s): CKTOTAL, CKMB, CKMBINDEX, TROPONINI in the last 168 hours. BNP (last 3 results) No results for input(s): PROBNP in the last 8760 hours. HbA1C: No results for input(s): HGBA1C in the last 72 hours. CBG: No results for input(s): GLUCAP in the last 168 hours. Lipid Profile: No results for input(s): CHOL, HDL, LDLCALC, TRIG, CHOLHDL, LDLDIRECT in the last 72 hours. Thyroid Function Tests: No results for input(s): TSH, T4TOTAL, FREET4, T3FREE, THYROIDAB in the last 72 hours. Anemia Panel: No results for input(s): VITAMINB12, FOLATE, FERRITIN, TIBC, IRON, RETICCTPCT in the last 72 hours. Urine analysis:    Component Value Date/Time   COLORURINE YELLOW (A) 09/08/2020 1959   APPEARANCEUR HAZY (A) 09/08/2020 1959   LABSPEC 1.008 09/08/2020 1959   PHURINE 6.0 09/08/2020 1959   GLUCOSEU NEGATIVE 09/08/2020 1959   HGBUR MODERATE (A) 09/08/2020 1959   BILIRUBINUR NEGATIVE 09/08/2020 1959   KETONESUR NEGATIVE 09/08/2020 1959   PROTEINUR NEGATIVE 09/08/2020 1959   NITRITE NEGATIVE 09/08/2020 1959   LEUKOCYTESUR NEGATIVE 09/08/2020 1959    Radiological Exams on Admission: No results found.   Assessment/Plan 60 year old female with history of HTN, hypothyroidism, CKD IIIa s/p recent total knee  arthroplasty on 4/13, who presenting for the second time in 4 days with intractable vomiting and diarrhea.      Acute gastroenteritis - Supportive care  with IV fluids, IV antiemetics - Follow stool for C. difficile and gastrointestinal panel - Keep n.p.o. for now then clear liquids to advance as tolerated -CT abdomen and pelvis from 09/04/2020 showed no acute intra-abdominal or intrapelvic process.  Hyperbilirubinemia - Bilirubin 2.3 up from 1.2 about 3 weeks prior -Patient has history of cholecystectomy.  Imaging not repeated -Monitor    Acute kidney injury superimposed on CKD llla (HCC) - Prerenal secondary to dehydration from vomiting and diarrhea - IV hydration and monitor    S/P TKR (total knee replacement) on 3/31  On anticoagulation with apixaban - On apixaban for 3 months post op for DVT prophylaxis but unable to hold down medication for three days --Lovenox for DVT prophylaxis - Pain management as needed    Hypothyroidism - Levothyroxine when tolerating    Depression - Continue Wellbutrin, citalopram pending med rec    HTN (hypertension) - Labetalol as needed BP over 160/91 when n.p.o., then resume home meds when tolerating    DVT prophylaxis: Lovenox  Code Status: full code  Family Communication:  none  Disposition Plan: Back to previous home environment Consults called: none  Status:observation    Andris BaumannHazel V Chyna Kneece MD [facility]     09/08/2020, 10:24 PM

## 2020-09-09 ENCOUNTER — Other Ambulatory Visit: Payer: Self-pay

## 2020-09-09 DIAGNOSIS — K529 Noninfective gastroenteritis and colitis, unspecified: Secondary | ICD-10-CM | POA: Diagnosis not present

## 2020-09-09 LAB — GLUCOSE, CAPILLARY
Glucose-Capillary: 112 mg/dL — ABNORMAL HIGH (ref 70–99)
Glucose-Capillary: 102 mg/dL — ABNORMAL HIGH (ref 70–99)

## 2020-09-09 LAB — HEMOGLOBIN A1C
Hgb A1c MFr Bld: 4.9 % (ref 4.8–5.6)
Mean Plasma Glucose: 93.93 mg/dL

## 2020-09-09 LAB — HIV ANTIBODY (ROUTINE TESTING W REFLEX): HIV Screen 4th Generation wRfx: NONREACTIVE

## 2020-09-09 MED ORDER — INSULIN ASPART 100 UNIT/ML IJ SOLN: 0.0000 [IU] | Freq: Three times a day (TID) | INTRAMUSCULAR | Status: DC

## 2020-09-09 MED ORDER — BUPROPION HCL ER (XL) 150 MG PO TB24
150.0000 mg | ORAL_TABLET | Freq: Every day | ORAL | Status: DC
  Administered 2020-09-09 – 2020-09-10 (×2): 150 mg via ORAL
  Filled 2020-09-09 (×2): qty 1

## 2020-09-09 MED ORDER — TRAMADOL HCL 50 MG PO TABS: 50.0000 mg | ORAL_TABLET | Freq: Four times a day (QID) | ORAL | Status: DC | PRN

## 2020-09-09 MED ORDER — INSULIN ASPART 100 UNIT/ML IJ SOLN: 0.0000 [IU] | Freq: Every day | INTRAMUSCULAR | Status: DC

## 2020-09-09 MED ORDER — CITALOPRAM HYDROBROMIDE 20 MG PO TABS
40.0000 mg | ORAL_TABLET | Freq: Every day | ORAL | Status: DC
  Administered 2020-09-09 – 2020-09-10 (×2): 40 mg via ORAL
  Filled 2020-09-09 (×2): qty 2

## 2020-09-09 MED ORDER — MORPHINE SULFATE (PF) 2 MG/ML IV SOLN
2.0000 mg | INTRAVENOUS | Status: DC | PRN
  Filled 2020-09-09: qty 1

## 2020-09-09 MED ORDER — APIXABAN 5 MG PO TABS
5.0000 mg | ORAL_TABLET | Freq: Two times a day (BID) | ORAL | Status: DC
  Administered 2020-09-09 – 2020-09-10 (×3): 5 mg via ORAL
  Filled 2020-09-09 (×3): qty 1

## 2020-09-09 MED ORDER — LEVOTHYROXINE SODIUM 150 MCG PO TABS
150.0000 ug | ORAL_TABLET | Freq: Every day | ORAL | Status: DC
  Administered 2020-09-10: 150 ug via ORAL
  Filled 2020-09-09: qty 3
  Filled 2020-09-09: qty 1

## 2020-09-09 NOTE — Progress Notes (Signed)
[facility]  - [facility]   PATIENT NAME: Sara Hebert    MR#:  [id number]  DATE OF BIRTH:  June 23, 1960  SUBJECTIVE:   Patient came in with continued nausea vomiting and liquidy stool since she left the emergency room on fourth of May. Unable to keep oral meds. Today feeling a bit better. No nausea vomiting or diarrhea since yesterday. Tolerated clear liquid diet. Has some abdominal discomfort.  Sister-in-law in the room REVIEW OF SYSTEMS:   Review of Systems  Constitutional: Negative for chills, fever and weight loss.  HENT: Negative for ear discharge, ear pain and nosebleeds.   Eyes: Negative for blurred vision, pain and discharge.  Respiratory: Negative for sputum production, shortness of breath, wheezing and stridor.   Cardiovascular: Negative for chest pain, palpitations, orthopnea and PND.  Gastrointestinal: Positive for abdominal pain and nausea. Negative for diarrhea and vomiting.  Genitourinary: Negative for frequency and urgency.  Musculoskeletal: Negative for back pain and joint pain.  Neurological: Positive for weakness. Negative for sensory change, speech change and focal weakness.  Psychiatric/Behavioral: Negative for depression and hallucinations. The patient is not nervous/anxious.    Tolerating Diet:CLD Tolerating PT:   DRUG ALLERGIES:  No Known Allergies  VITALS:  Blood pressure 111/63, pulse 80, temperature 97.7 F (36.5 C), resp. rate 18, height 5\' 5"  (1.651 m), weight 94 kg, SpO2 100 %.  PHYSICAL EXAMINATION:   Physical Exam  GENERAL:  60 y.o.-year-old patient lying in the bed with no acute distress.  HEENT: Head atraumatic, normocephalic. Oropharynx and nasopharynx clear. .  LUNGS: Normal breath sounds bilaterally, no wheezing, rales, rhonchi. No use of accessory muscles of respiration.  CARDIOVASCULAR: S1, S2 normal. No murmurs, rubs, or gallops.  ABDOMEN: Soft, nontender, nondistended. Bowel sounds present. No  organomegaly or mass.  EXTREMITIES: No cyanosis, clubbing or edema b/l.   Left knee incision appears OK NEUROLOGIC: grossly nonfocal. Appears weak PSYCHIATRIC:  patient is alert and oriented x 3.  SKIN: No obvious rash, lesion, or ulcer.   LABORATORY PANEL:  CBC Recent Labs  Lab 09/08/20 1914  WBC 9.8  HGB 13.9  HCT 41.6  PLT 294    Chemistries  Recent Labs  Lab 09/08/20 1914  NA 134*  K 3.5  CL 96*  CO2 25  GLUCOSE 118*  BUN 24*  CREATININE 1.86*  CALCIUM 9.5  AST 21  ALT 21  ALKPHOS 113  BILITOT 2.3*   Cardiac Enzymes No results for input(s): TROPONINI in the last 168 hours. RADIOLOGY:  No results found. ASSESSMENT AND PLAN:  60 year old female with history of HTN, hypothyroidism, CKD IIIa s/p recent total knee arthroplasty on 4/13, who presenting for the second time in 4 days with intractable vomiting and diarrhea.      Acute gastroenteritis - Supportive care with IV fluids, IV antiemetics - Follow stool for C. difficile and gastrointestinal panel--pt so far not able to give sample. Symptoms have resolved. D/c stool studies - start clear liquids to advance as tolerated -CT abdomen and pelvis from 09/04/2020 showed no acute intra-abdominal or intrapelvic process.  Hyperbilirubinemia - Bilirubin 2.3 up from 1.2 about 3 weeks prior -Patient has history of cholecystectomy.  Imaging not repeated -Monitor CMP in am    Acute kidney injury superimposed on CKD llla (HCC) - Prerenal secondary to dehydration from vomiting and diarrhea - IV hydration and monitor --baseline creat 1.1--1.4 --came in with creat 1.89    S/P TKR (total knee replacement) on 3/31  On anticoagulation with  apixaban - On apixaban for 3 months post op for DVT prophylaxis  -started on Lovenox for DVT prophylaxis---now switch to po eliquis since pt is able to take CLD - Pain management as needed    Hypothyroidism - Levothyroxine when tolerating    Depression - Continue Wellbutrin,  citalopram pending med rec    HTN (hypertension) - Labetalol as needed BP over 160/91 when n.p.o., then resume home meds when tolerating    DVT prophylaxis: Lovenox  Code Status: full code  Family Communication:  SIL in the room Disposition Plan: Back to previous home environment Consults called: none  Status:observation  Level of care: Med-Surg Status is: Observation  The patient remains OBS appropriate and will d/c before 2 midnights.  Dispo: The patient is from: Home              Anticipated d/c is to: Home              Patient currently is not medically stable to d/c.   Difficult to place patient No        TOTAL TIME TAKING CARE OF THIS PATIENT: 25 minutes.  >50% time spent on counselling and coordination of care  Note: This dictation was prepared with Dragon dictation along with smaller phrase technology. Any transcriptional errors that result from this process are unintentional.  Enedina Finner M.D    [facility]   CC: [facility] physician; [facility]Patient ID: Willette Cluster, female   DOB: 03/02/1961, 60 y.o.   MRN: [id number]

## 2020-09-09 NOTE — Plan of Care (Signed)

## 2020-09-09 NOTE — Progress Notes (Signed)
   09/09/20 0930  Clinical Encounter Type  Visited With Patient  Visit Type Initial;Spiritual support;Social support  Referral From Nurse  Consult/Referral To Chaplain   Chaplain responded to an AD request from PT. Tp is a staff here at the hospital. Chaplain started AD education process, but Doctor came in to assess PT. Chaplain will follow up with PT later.

## 2020-09-10 DIAGNOSIS — K529 Noninfective gastroenteritis and colitis, unspecified: Secondary | ICD-10-CM | POA: Diagnosis not present

## 2020-09-10 LAB — COMPREHENSIVE METABOLIC PANEL
ALT: 14 U/L (ref 0–44)
AST: 14 U/L — ABNORMAL LOW (ref 15–41)
Albumin: 3.5 g/dL (ref 3.5–5.0)
Alkaline Phosphatase: 79 U/L (ref 38–126)
Anion gap: 9 (ref 5–15)
BUN: 23 mg/dL — ABNORMAL HIGH (ref 6–20)
CO2: 25 mmol/L (ref 22–32)
Calcium: 8.7 mg/dL — ABNORMAL LOW (ref 8.9–10.3)
Chloride: 106 mmol/L (ref 98–111)
Creatinine, Ser: 1.73 mg/dL — ABNORMAL HIGH (ref 0.44–1.00)
GFR, Estimated: 34 mL/min — ABNORMAL LOW (ref >60)
Glucose, Bld: 135 mg/dL — ABNORMAL HIGH (ref 70–99)
Potassium: 3 mmol/L — ABNORMAL LOW (ref 3.5–5.1)
Sodium: 140 mmol/L (ref 135–145)
Total Bilirubin: 1.2 mg/dL (ref 0.3–1.2)
Total Protein: 6 g/dL — ABNORMAL LOW (ref 6.5–8.1)

## 2020-09-10 LAB — MAGNESIUM: Magnesium: 1.8 mg/dL (ref 1.7–2.4)

## 2020-09-10 LAB — GLUCOSE, CAPILLARY: Glucose-Capillary: 119 mg/dL — ABNORMAL HIGH (ref 70–99)

## 2020-09-10 MED ORDER — POTASSIUM CHLORIDE 10 MEQ/100ML IV SOLN
10.0000 meq | INTRAVENOUS | Status: DC
  Administered 2020-09-10 (×2): 10 meq via INTRAVENOUS
  Filled 2020-09-10 (×2): qty 100

## 2020-09-10 MED ORDER — POTASSIUM CHLORIDE CRYS ER 20 MEQ PO TBCR
40.0000 meq | EXTENDED_RELEASE_TABLET | Freq: Once | ORAL | Status: AC
  Administered 2020-09-10: 40 meq via ORAL
  Filled 2020-09-10: qty 2

## 2020-09-10 NOTE — Progress Notes (Signed)
IV removed and reviewed discharge instructions - verbalized understanding

## 2020-09-10 NOTE — Discharge Summary (Addendum)
[facility] [place] [facility]   PATIENT NAME: Sara Hebert    MR#:  [id number]  DATE OF BIRTH:  16-Jun-1960  DATE OF ADMISSION:  09/08/2020 ADMITTING PHYSICIAN: Andris Baumann, MD  DATE OF DISCHARGE: 09/10/2020  PRIMARY CARE PHYSICIAN: Enid Baas, MD    ADMISSION DIAGNOSIS:  Dehydration [E86.0] Acute gastroenteritis [K52.9] Acute renal failure superimposed on stage 3b chronic kidney disease, unspecified acute renal failure type (HCC) [N17.9, N18.32]  DISCHARGE DIAGNOSIS:  Acute Gastroenteritis Acute on CKD IIIB  SECONDARY DIAGNOSIS:   Past Medical History:  Diagnosis Date  . Anxiety   . Arthritis   . Chronic kidney disease    early stages. creatnine recently was normal  . Depression   . GERD (gastroesophageal reflux disease)   . Hypertension   . Hypothyroidism     HOSPITAL COURSE:  60 year old female with history ofHTN, hypothyroidism, CKD IIIas/precent total knee arthroplasty on 4/13, who presentingfor the second time in 4 days with intractable vomiting and diarrhea.   Acute gastroenteritis -Supportive care with IV fluids, IV antiemetics -Follow stool for C. difficile and gastrointestinal panel--pt so far not able to give sample. Symptoms have resolved. D/c stool studies - start clear liquids to advance as tolerated -CT abdomen and pelvis from 09/04/2020 showed no acute intra-abdominal or intrapelvic process.  Hyperbilirubinemia from N/V -Bilirubin 2.3 up from 1.2 about 3 weeks prior -Patient has history ofcholecystectomy. Imaging not repeated -repeat labs show improvment  Acute kidney injury superimposed on CKD llla (HCC) Mild hypokalemia -Prerenal secondary to dehydration from vomiting and diarrhea - recievedIV hydration  --baseline creat 1.1--1.4 --came in with creat 1.89--1.7 --pt got IV KCL prior to d/c  S/P TKR (total knee replacement) on3/31 Onanticoagulation with apixaban -On apixaban for 3  months post opforDVT prophylaxis  --started on Lovenox for DVT prophylaxis---now switched to po eliquis since pt is able to take oral diet -Pain management as needed  Hypothyroidism -Levothyroxine  Depression -Continue Wellbutrin, citalopram   HTN (hypertension) -resume lisinopril  Weight Loss Mnx --pt takes Inj Liraglutiderxed by PCP  DVT prophylaxis:Lovenox  Code Status:full code Family Communication:SIL in the room Disposition Plan:Back to previous home environment Consults called:none Status:observation  Level of care: Med-Surg Status is: Observation   Dispo: The patient is from: Home  Anticipated d/c is to: Home  Patient currently is  medically stable to d/c.              Difficult to place patient No   D/c home--pt agreeable CONSULTS OBTAINED:    DRUG ALLERGIES:  No Known Allergies  DISCHARGE MEDICATIONS:   Allergies as of 09/10/2020   No Known Allergies     Medication List    TAKE these medications   acetaminophen 325 MG tablet Commonly known as: TYLENOL Take 1-2 tablets (325-650 mg total) by mouth every 6 (six) hours as needed for mild pain (pain score 1-3 or temp > 100.5).   apixaban 5 MG Tabs tablet Commonly known as: ELIQUIS Take 1 tablet (5 mg total) by mouth 2 (two) times daily.   buPROPion 300 MG 24 hr tablet Commonly known as: WELLBUTRIN XL TAKE 1 TABLET BY MOUTH ONCE DAILY FOR MOOD   citalopram 40 MG tablet Commonly known as: CELEXA TAKE 1 TABLET BY MOUTH ONCE DAILY FOR DEPRESSION   colchicine 0.6 MG tablet Take 0.6 mg by mouth daily as needed (gout flares).   diphenhydrAMINE 50 MG capsule Commonly known as: BENADRYL Take 50 mg by mouth every 6 (six) hours as  needed for itching or sleep.   diphenhydramine-acetaminophen 25-500 MG Tabs tablet Commonly known as: TYLENOL PM Take 2 tablets by mouth at bedtime as needed (pain/sleep.).   docusate sodium 100 MG capsule Commonly  known as: COLACE Take 1 capsule (100 mg total) by mouth 2 (two) times daily. What changed:  when to take this reasons to take this   levothyroxine 150 MCG tablet Commonly known as: SYNTHROID TAKE 1 TABLET (150 MCG TOTAL) BY MOUTH ONCE DAILY   lisinopril 5 MG tablet Commonly known as: ZESTRIL TAKE 1 TABLET BY MOUTH ONCE DAILY   ondansetron 4 MG disintegrating tablet Commonly known as: Zofran ODT Take 1 tablet (4 mg total) by mouth every 8 (eight) hours as needed for nausea or vomiting.   Saxenda 18 MG/3ML Sopn Generic drug: Liraglutide -Weight Management INJECT 1.8 MG SUBCUTANEOUSLY ONCE DAILY FOR 60 DAYS What changed:  how much to take when to take this   Unifine Pentips 31G X 5 MM Misc Generic drug: Insulin Pen Needle USE AS DIRECTED WITH SAXENDA   Vitamin D (Ergocalciferol) 1.25 MG (50000 UNIT) Caps capsule Commonly known as: DRISDOL TAKE 1 CAPSULE BY MOUTH ONCE A WEEK       If you experience worsening of your admission symptoms, develop shortness of breath, life threatening emergency, suicidal or homicidal thoughts you must seek medical attention immediately by calling 911 or calling your MD immediately  if symptoms less severe.  You Must read complete instructions/literature along with all the possible adverse reactions/side effects for all the Medicines you take and that have been prescribed to you. Take any new Medicines after you have completely understood and accept all the possible adverse reactions/side effects.   Please note  You were cared for by a hospitalist during your hospital stay. If you have any questions about your discharge medications or the care you received while you were in the hospital after you are discharged, you can call the unit and asked to speak with the hospitalist on call if the hospitalist that took care of you is not available. Once you are discharged, your primary care physician will handle any further medical issues. Please note that NO  REFILLS for any discharge medications will be authorized once you are discharged, as it is imperative that you return to your primary care physician (or establish a relationship with a primary care physician if you do not have one) for your aftercare needs so that they can reassess your need for medications and monitor your lab values. Today   SUBJECTIVE   Tolerating po soft diet. No N/V/D No abd pain  VITAL SIGNS:  Blood pressure 118/64, pulse 76, temperature 98.4 F (36.9 C), resp. rate 18, height 5\' 5"  (1.651 m), weight 94 kg, SpO2 100 %.  I/O:    Intake/Output Summary (Last 24 hours) at 09/10/2020 0907 Last data filed at 09/10/2020 [id number] Gross per 24 hour  Intake 449.06 ml  Output --  Net 449.06 ml    PHYSICAL EXAMINATION:  GENERAL:  60 y.o.-year-old patient lying in the bed with no acute distress. LUNGS: Normal breath sounds bilaterally, no wheezing, rales,rhonchi or crepitation. No use of accessory muscles of respiration.  CARDIOVASCULAR: S1, S2 normal. No murmurs, rubs, or gallops.  ABDOMEN: Soft, non-tender, non-distended. Bowel sounds present. No organomegaly or mass.  EXTREMITIES: No pedal edema, cyanosis, or clubbing.  NEUROLOGIC: Cranial nerves II through XII are intact. Muscle strength 5/5 in all extremities. Sensation intact. Gait not checked.  PSYCHIATRIC: The patient is alert  and oriented x 3.  SKIN: No obvious rash, lesion, or ulcer.   DATA REVIEW:   CBC  Recent Labs  Lab 09/08/20 1914  WBC 9.8  HGB 13.9  HCT 41.6  PLT 294    Chemistries  Recent Labs  Lab 09/10/20 0310  NA 140  K 3.0*  CL 106  CO2 25  GLUCOSE 135*  BUN 23*  CREATININE 1.73*  CALCIUM 8.7*  MG 1.8  AST 14*  ALT 14  ALKPHOS 79  BILITOT 1.2    Microbiology Results   Recent Results (from the past 240 hour(s))  Resp Panel by RT-PCR (Flu A&B, Covid) Nasopharyngeal Swab     Status: None   Collection Time: 09/08/20  9:25 PM   Specimen: Nasopharyngeal Swab; Nasopharyngeal(NP)  swabs in vial transport medium  Result Value Ref Range Status   SARS Coronavirus 2 by RT PCR NEGATIVE NEGATIVE Final    Comment: (NOTE) SARS-CoV-2 target nucleic acids are NOT DETECTED.  The SARS-CoV-2 RNA is generally detectable in upper respiratory specimens during the acute phase of infection. The lowest concentration of SARS-CoV-2 viral copies this assay can detect is 138 copies/mL. A negative result does not preclude SARS-Cov-2 infection and should not be used as the sole basis for treatment or other patient management decisions. A negative result may occur with  improper specimen collection/handling, submission of specimen other than nasopharyngeal swab, presence of viral mutation(s) within the areas targeted by this assay, and inadequate number of viral copies(<138 copies/mL). A negative result must be combined with clinical observations, patient history, and epidemiological information. The expected result is Negative.  Fact Sheet for Patients:  BloggerCourse.com  Fact Sheet for Healthcare Providers:  SeriousBroker.it  This test is no t yet approved or cleared by the [place] FDA and  has been authorized for detection and/or diagnosis of SARS-CoV-2 by FDA under an Emergency Use Authorization (EUA). This EUA will remain  in effect (meaning this test can be used) for the duration of the COVID-19 declaration under Section 564(b)(1) of the Act, 21 U.S.C.section 360bbb-3(b)(1), unless the authorization is terminated  or revoked sooner.       Influenza A by PCR NEGATIVE NEGATIVE Final   Influenza B by PCR NEGATIVE NEGATIVE Final    Comment: (NOTE) The Xpert Xpress SARS-CoV-2/FLU/RSV plus assay is intended as an aid in the diagnosis of influenza from Nasopharyngeal swab specimens and should not be used as a sole basis for treatment. Nasal washings and aspirates are unacceptable for Xpert Xpress  SARS-CoV-2/FLU/RSV testing.  Fact Sheet for Patients: BloggerCourse.com  Fact Sheet for Healthcare Providers: SeriousBroker.it  This test is not yet approved or cleared by the [place] FDA and has been authorized for detection and/or diagnosis of SARS-CoV-2 by FDA under an Emergency Use Authorization (EUA). This EUA will remain in effect (meaning this test can be used) for the duration of the COVID-19 declaration under Section 564(b)(1) of the Act, 21 U.S.C. section 360bbb-3(b)(1), unless the authorization is terminated or revoked.  Performed at [facility], [address], [place], [place] [address]     RADIOLOGY:  No results found.   CODE STATUS:     Code Status Orders  (From admission, onward)         Start     Ordered   09/08/20 2222  Full code  Continuous        09/08/20 2224        Code Status History    Date Active Date Inactive  Code Status Order ID Comments User Context   08/01/2020 1256 08/03/2020 1821 Full Code [id number]  Kennedy Bucker, MD Inpatient   08/03/2019 1425 08/05/2019 1549 Full Code [id number]  Kennedy Bucker, MD Inpatient   Advance Care Planning Activity       TOTAL TIME TAKING CARE OF THIS PATIENT: *35* minutes.    Enedina Finner M.D  [facility]    CC: Primary care physician; Enid Baas, MD

## 2020-09-10 NOTE — Plan of Care (Signed)

## 2020-09-16 ENCOUNTER — Other Ambulatory Visit: Payer: Self-pay

## 2020-09-27 ENCOUNTER — Ambulatory Visit: Payer: No Typology Code available for payment source

## 2020-10-09 ENCOUNTER — Ambulatory Visit
Admission: RE | Admit: 2020-10-09 | Discharge: 2020-10-09 | Disposition: A | Discharge status: Home / Self Care | Payer: No Typology Code available for payment source | Source: Ambulatory Visit | Attending: Internal Medicine | Admitting: Internal Medicine

## 2020-10-09 ENCOUNTER — Other Ambulatory Visit: Payer: Self-pay

## 2020-10-09 DIAGNOSIS — N2889 Other specified disorders of kidney and ureter: Secondary | ICD-10-CM | POA: Insufficient documentation

## 2020-10-09 MED ORDER — GADOBUTROL 1 MMOL/ML IV SOLN
9.0000 mL | Freq: Once | INTRAVENOUS | Status: AC | PRN
  Administered 2020-10-09: 9 mL via INTRAVENOUS

## 2020-10-24 ENCOUNTER — Other Ambulatory Visit: Payer: Self-pay

## 2020-10-24 MED FILL — Insulin Pen Needle 31 G X 5 MM (1/5" or 3/16"): 90 days supply | Qty: 100 | Fill #0 | Status: AC

## 2020-11-24 ENCOUNTER — Other Ambulatory Visit: Payer: Self-pay

## 2020-11-24 MED FILL — Bupropion HCl Tab ER 24HR 300 MG: ORAL | 90 days supply | Qty: 90 | Fill #0 | Status: AC

## 2020-11-24 MED FILL — Ergocalciferol Cap 1.25 MG (50000 Unit): ORAL | 84 days supply | Qty: 12 | Fill #0 | Status: AC

## 2020-11-24 MED FILL — Citalopram Hydrobromide Tab 40 MG (Base Equiv): ORAL | 90 days supply | Qty: 90 | Fill #0 | Status: AC

## 2020-11-25 ENCOUNTER — Other Ambulatory Visit: Payer: Self-pay

## 2020-11-26 ENCOUNTER — Other Ambulatory Visit: Payer: Self-pay

## 2020-11-27 ENCOUNTER — Other Ambulatory Visit: Payer: Self-pay

## 2020-11-28 ENCOUNTER — Other Ambulatory Visit: Payer: Self-pay

## 2020-11-29 ENCOUNTER — Other Ambulatory Visit: Payer: Self-pay

## 2020-11-29 MED ORDER — LEVOTHYROXINE SODIUM 150 MCG PO TABS
ORAL_TABLET | ORAL | 1 refills | Status: AC
  Filled 2020-11-29 – 2020-12-12 (×2): qty 90, 90d supply, fill #0
  Filled 2021-05-13: qty 90, 90d supply, fill #1

## 2020-11-29 MED ORDER — LISINOPRIL 5 MG PO TABS
5.0000 mg | ORAL_TABLET | Freq: Every day | ORAL | 1 refills | Status: AC
  Filled 2020-11-29 – 2020-12-12 (×2): qty 90, 90d supply, fill #0
  Filled 2021-05-13: qty 90, 90d supply, fill #1

## 2020-12-02 ENCOUNTER — Other Ambulatory Visit: Payer: Self-pay | Admitting: Internal Medicine

## 2020-12-02 DIAGNOSIS — Z1231 Encounter for screening mammogram for malignant neoplasm of breast: Secondary | ICD-10-CM

## 2020-12-10 ENCOUNTER — Other Ambulatory Visit: Payer: Self-pay

## 2020-12-12 ENCOUNTER — Other Ambulatory Visit: Payer: Self-pay

## 2020-12-25 ENCOUNTER — Other Ambulatory Visit: Payer: Self-pay

## 2021-01-02 ENCOUNTER — Other Ambulatory Visit: Payer: Self-pay

## 2021-01-02 ENCOUNTER — Ambulatory Visit
Admission: RE | Admit: 2021-01-02 | Discharge: 2021-01-02 | Disposition: A | Discharge status: Home / Self Care | Payer: No Typology Code available for payment source | Source: Ambulatory Visit | Attending: Internal Medicine | Admitting: Internal Medicine

## 2021-01-02 DIAGNOSIS — Z1231 Encounter for screening mammogram for malignant neoplasm of breast: Secondary | ICD-10-CM | POA: Insufficient documentation

## 2021-01-21 ENCOUNTER — Other Ambulatory Visit: Payer: Self-pay

## 2021-01-21 MED ORDER — ESTRADIOL 0.1 MG/GM VA CREA
TOPICAL_CREAM | VAGINAL | 1 refills | Status: AC
  Filled 2021-01-21: qty 42.5, 30d supply, fill #0
  Filled 2021-01-21: qty 42.5, 90d supply, fill #0

## 2021-01-21 MED ORDER — CITALOPRAM HYDROBROMIDE 40 MG PO TABS
ORAL_TABLET | ORAL | 1 refills | Status: DC
  Filled 2021-01-21 – 2021-05-13 (×2): qty 90, 90d supply, fill #0
  Filled 2021-08-19: qty 90, 90d supply, fill #1

## 2021-01-23 ENCOUNTER — Other Ambulatory Visit: Payer: Self-pay

## 2021-02-04 ENCOUNTER — Other Ambulatory Visit: Payer: Self-pay

## 2021-02-04 MED ORDER — PEG 3350-KCL-NA BICARB-NACL 420 G PO SOLR
ORAL | 0 refills | Status: AC
  Filled 2021-02-04 – 2021-03-31 (×2): qty 4000, 1d supply, fill #0

## 2021-02-14 ENCOUNTER — Other Ambulatory Visit: Payer: Self-pay

## 2021-02-17 ENCOUNTER — Other Ambulatory Visit: Payer: Self-pay

## 2021-02-17 MED ORDER — SAXENDA 18 MG/3ML ~~LOC~~ SOPN
PEN_INJECTOR | SUBCUTANEOUS | 3 refills | Status: DC
  Filled 2021-02-17: qty 15, 60d supply, fill #0
  Filled 2021-04-11 (×2): qty 15, 60d supply, fill #1
  Filled 2021-05-13: qty 15, 60d supply, fill #2
  Filled 2021-06-11: qty 15, 30d supply, fill #2
  Filled 2021-07-09: qty 15, 30d supply, fill #3

## 2021-02-18 ENCOUNTER — Other Ambulatory Visit: Payer: Self-pay

## 2021-02-28 ENCOUNTER — Other Ambulatory Visit: Payer: Self-pay

## 2021-03-03 ENCOUNTER — Other Ambulatory Visit: Payer: Self-pay

## 2021-03-04 ENCOUNTER — Other Ambulatory Visit: Payer: Self-pay

## 2021-03-06 ENCOUNTER — Other Ambulatory Visit: Payer: Self-pay

## 2021-03-10 ENCOUNTER — Other Ambulatory Visit: Payer: Self-pay

## 2021-03-11 ENCOUNTER — Other Ambulatory Visit: Payer: Self-pay

## 2021-03-13 ENCOUNTER — Other Ambulatory Visit: Payer: Self-pay

## 2021-03-13 MED ORDER — UNIFINE PENTIPS 31G X 5 MM MISC
1 refills | Status: AC
  Filled 2021-03-13: qty 100, 90d supply, fill #0
  Filled 2021-08-19: qty 100, 90d supply, fill #1

## 2021-03-31 ENCOUNTER — Other Ambulatory Visit: Payer: Self-pay

## 2021-04-04 ENCOUNTER — Encounter: Payer: Self-pay | Admitting: Gastroenterology

## 2021-04-07 ENCOUNTER — Ambulatory Visit: Payer: No Typology Code available for payment source | Admitting: Anesthesiology

## 2021-04-07 ENCOUNTER — Encounter
Admission: RE | Disposition: A | Discharge status: Home / Self Care | Payer: Self-pay | Source: Ambulatory Visit | Attending: Gastroenterology

## 2021-04-07 ENCOUNTER — Ambulatory Visit
Admission: RE | Admit: 2021-04-07 | Discharge: 2021-04-07 | Disposition: A | Discharge status: Home / Self Care | Payer: No Typology Code available for payment source | Source: Ambulatory Visit | Attending: Gastroenterology | Admitting: Gastroenterology

## 2021-04-07 ENCOUNTER — Other Ambulatory Visit: Payer: Self-pay

## 2021-04-07 ENCOUNTER — Encounter: Payer: Self-pay | Admitting: Gastroenterology

## 2021-04-07 DIAGNOSIS — I129 Hypertensive chronic kidney disease with stage 1 through stage 4 chronic kidney disease, or unspecified chronic kidney disease: Secondary | ICD-10-CM | POA: Insufficient documentation

## 2021-04-07 DIAGNOSIS — E039 Hypothyroidism, unspecified: Secondary | ICD-10-CM | POA: Insufficient documentation

## 2021-04-07 DIAGNOSIS — Z79899 Other long term (current) drug therapy: Secondary | ICD-10-CM | POA: Insufficient documentation

## 2021-04-07 DIAGNOSIS — K5730 Diverticulosis of large intestine without perforation or abscess without bleeding: Secondary | ICD-10-CM | POA: Diagnosis not present

## 2021-04-07 DIAGNOSIS — Z1211 Encounter for screening for malignant neoplasm of colon: Secondary | ICD-10-CM | POA: Insufficient documentation

## 2021-04-07 DIAGNOSIS — K644 Residual hemorrhoidal skin tags: Secondary | ICD-10-CM | POA: Insufficient documentation

## 2021-04-07 DIAGNOSIS — Z8371 Family history of colonic polyps: Secondary | ICD-10-CM | POA: Diagnosis not present

## 2021-04-07 DIAGNOSIS — N189 Chronic kidney disease, unspecified: Secondary | ICD-10-CM | POA: Insufficient documentation

## 2021-04-07 DIAGNOSIS — K219 Gastro-esophageal reflux disease without esophagitis: Secondary | ICD-10-CM | POA: Diagnosis not present

## 2021-04-07 DIAGNOSIS — K648 Other hemorrhoids: Secondary | ICD-10-CM | POA: Diagnosis not present

## 2021-04-07 HISTORY — DX: Allergy, unspecified, initial encounter: T78.40XA

## 2021-04-07 HISTORY — DX: Cyst of kidney, acquired: N28.1

## 2021-04-07 HISTORY — DX: Disorder of thyroid, unspecified: E07.9

## 2021-04-07 HISTORY — PX: COLONOSCOPY WITH PROPOFOL: SHX5780

## 2021-04-07 SURGERY — COLONOSCOPY WITH PROPOFOL
Anesthesia: General

## 2021-04-07 MED ORDER — PROPOFOL 500 MG/50ML IV EMUL
INTRAVENOUS | Status: DC | PRN
  Administered 2021-04-07: 150 ug/kg/min via INTRAVENOUS

## 2021-04-07 MED ORDER — PROPOFOL 500 MG/50ML IV EMUL
INTRAVENOUS | Status: AC
  Filled 2021-04-07: qty 50

## 2021-04-07 MED ORDER — SODIUM CHLORIDE 0.9 % IV SOLN: INTRAVENOUS | Status: DC

## 2021-04-07 MED ORDER — PROPOFOL 10 MG/ML IV BOLUS
INTRAVENOUS | Status: AC
  Filled 2021-04-07: qty 20

## 2021-04-07 NOTE — H&P (Signed)
Pre-Procedure H&P   Patient ID: Sara Hebert is a 60 y.o. female.  Gastroenterology Provider: Jaynie Collins, DO  PCP: Enid Baas, MD  Date: 04/07/2021  HPI Ms. Sara Hebert is a 60 y.o. female who presents today for Colonoscopy for surveillance colonoscopy- phx polyps, fhx crc (PGM) and colon polyps (brother and sister).  Brother and sister with colon polyps  Normal bm- no blood, melena, diarrhea, or constipation  2016 [facility] colonoscopy- one tubular adenoma  S/p cholecystectomy and hysterectomy Off of all anti coagulation 07/2020- cbc normal   Past Medical History:  Diagnosis Date   Allergy    Anxiety    Arthritis    Chronic kidney disease    early stages. creatnine recently was normal   Depression    GERD (gastroesophageal reflux disease)    Hypertension    Hypothyroidism    Kidney cysts    Thyroid disease     Past Surgical History:  Procedure Laterality Date   ABDOMINAL HYSTERECTOMY     BREAST BIOPSY  1997   neg   CHOLECYSTECTOMY     JOINT REPLACEMENT     TOTAL KNEE ARTHROPLASTY Right 08/03/2019   Procedure: RIGHT TOTAL KNEE ARTHROPLASTY;  Surgeon: Kennedy Bucker, MD;  Location: [facility] ORS;  Service: Orthopedics;  Laterality: Right;   TOTAL KNEE ARTHROPLASTY Left 08/01/2020   Procedure: TOTAL KNEE ARTHROPLASTY - [place] Neighbor to Assist;  Surgeon: Kennedy Bucker, MD;  Location: [facility] ORS;  Service: Orthopedics;  Laterality: Left;    Family History fhx crc (PGM) and colon polyps (brother and sister). No h/o GI disease or malignancy  Review of Systems  Constitutional:  Negative for activity change, appetite change, chills, diaphoresis, fatigue, fever and unexpected weight change.  HENT:  Negative for trouble swallowing and voice change.   Respiratory:  Negative for shortness of breath and wheezing.   Cardiovascular:  Negative for chest pain, palpitations and leg swelling.  Gastrointestinal:  Negative for abdominal distention,  abdominal pain, anal bleeding, blood in stool, constipation, diarrhea, nausea, rectal pain and vomiting.  Musculoskeletal:  Negative for arthralgias and myalgias.  Skin:  Negative for color change and pallor.  Neurological:  Negative for dizziness, syncope and weakness.  Psychiatric/Behavioral:  Negative for confusion.   All other systems reviewed and are negative.   Medications No current facility-administered medications on file prior to encounter.   Current Outpatient Medications on File Prior to Encounter  Medication Sig Dispense Refill   acetaminophen (TYLENOL) 325 MG tablet Take 1-2 tablets (325-650 mg total) by mouth every 6 (six) hours as needed for mild pain (pain score 1-3 or temp > 100.5). 30 tablet 0   buPROPion (WELLBUTRIN XL) 300 MG 24 hr tablet TAKE 1 TABLET BY MOUTH ONCE DAILY FOR MOOD 90 tablet 1   citalopram (CELEXA) 40 MG tablet Take 1 tablet (40 mg total) by mouth once daily 90 tablet 1   colchicine 0.6 MG tablet Take 0.6 mg by mouth daily as needed (gout flares).     diphenhydrAMINE (BENADRYL) 50 MG capsule Take 50 mg by mouth every 6 (six) hours as needed for itching or sleep.     diphenhydramine-acetaminophen (TYLENOL PM) 25-500 MG TABS tablet Take 2 tablets by mouth at bedtime as needed (pain/sleep.).     docusate sodium (COLACE) 100 MG capsule Take 1 capsule (100 mg total) by mouth 2 (two) times daily. (Patient taking differently: Take 100 mg by mouth daily as needed for mild constipation or moderate constipation.) 10 capsule 0  estradiol (ESTRACE) 0.1 MG/GM vaginal cream Apply a pea-sized amount into the vagina at bedtime twice a week 42.5 g 1   Insulin Pen Needle 31G X 5 MM MISC USE AS DIRECTED WITH SAXENDA 100 each 1   levothyroxine (SYNTHROID) 150 MCG tablet TAKE 1 TABLET (150 MCG TOTAL) BY MOUTH ONCE DAILY 90 tablet 1   lisinopril (ZESTRIL) 5 MG tablet TAKE 1 TABLET BY MOUTH ONCE DAILY 90 tablet 1   ondansetron (ZOFRAN ODT) 4 MG disintegrating tablet Take 1  tablet (4 mg total) by mouth every 8 (eight) hours as needed for nausea or vomiting. 20 tablet 0   Vitamin D, Ergocalciferol, (DRISDOL) 1.25 MG (50000 UNIT) CAPS capsule TAKE 1 CAPSULE BY MOUTH ONCE A WEEK 12 capsule 1   apixaban (ELIQUIS) 5 MG TABS tablet Take 1 tablet (5 mg total) by mouth 2 (two) times daily. 60 tablet 2   levothyroxine (SYNTHROID) 150 MCG tablet TAKE 1 TABLET (150 MCG TOTAL) BY MOUTH ONCE DAILY 90 tablet 1   lisinopril (ZESTRIL) 5 MG tablet TAKE 1 TABLET BY MOUTH ONCE DAILY 90 tablet 1   polyethylene glycol-electrolytes (NULYTELY) 420 g solution Take 4,000 mLs by mouth once for 1 dose Use as directed for colonoscopy 4000 mL 0   [DISCONTINUED] enoxaparin (LOVENOX) 40 MG/0.4ML injection Inject 0.4 mLs (40 mg total) into the skin daily for 14 days. 5.6 mL 0    Pertinent medications related to GI and procedure were reviewed by me with the patient prior to the procedure   Current Facility-Administered Medications:    0.9 %  sodium chloride infusion, , Intravenous, Continuous, Jaynie Collins, DO, Last Rate: 20 mL/hr at 04/07/21 1233, New Bag at 04/07/21 1233      No Known Allergies Allergies were reviewed by me prior to the procedure  Objective    Vitals:   04/07/21 1231  BP: (!) 153/76  Pulse: 94  Resp: 18  Temp: (!) 97.1 F (36.2 C)  TempSrc: Temporal  SpO2: 100%  Weight: 90.7 kg  Height: 5\' 5"  (1.651 m)     Physical Exam Vitals and nursing note reviewed.  Constitutional:      General: She is not in acute distress.    Appearance: Normal appearance. She is not ill-appearing, toxic-appearing or diaphoretic.  HENT:     Head: Normocephalic and atraumatic.     Nose: Nose normal.     Mouth/Throat:     Mouth: Mucous membranes are moist.     Pharynx: Oropharynx is clear.  Eyes:     General: No scleral icterus.    Extraocular Movements: Extraocular movements intact.  Cardiovascular:     Rate and Rhythm: Normal rate and regular rhythm.     Heart  sounds: Normal heart sounds. No murmur heard.   No friction rub. No gallop.  Pulmonary:     Effort: Pulmonary effort is normal. No respiratory distress.     Breath sounds: Normal breath sounds. No wheezing, rhonchi or rales.  Abdominal:     General: Bowel sounds are normal. There is no distension.     Palpations: Abdomen is soft.     Tenderness: There is no abdominal tenderness. There is no guarding or rebound.  Musculoskeletal:     Cervical back: Neck supple.     Right lower leg: No edema.     Left lower leg: No edema.  Skin:    General: Skin is warm and dry.     Coloration: Skin is not jaundiced or pale.  Neurological:     General: No focal deficit present.     Mental Status: She is alert and oriented to person, place, and time. Mental status is at baseline.  Psychiatric:        Mood and Affect: Mood normal.        Behavior: Behavior normal.        Thought Content: Thought content normal.        Judgment: Judgment normal.     Assessment:  Ms. Sara Hebert is a 60 y.o. female  who presents today for Colonoscopy for surveillance colonoscopy- phx polyps, fhx crc (PGM) and colon polyps (brother and sister).  Plan:  Colonoscopy with possible intervention today  Colonoscopy with possible biopsy, control of bleeding, polypectomy, and interventions as necessary has been discussed with the patient/patient representative. Informed consent was obtained from the patient/patient representative after explaining the indication, nature, and risks of the procedure including but not limited to death, bleeding, perforation, missed neoplasm/lesions, cardiorespiratory compromise, and reaction to medications. Opportunity for questions was given and appropriate answers were provided. Patient/patient representative has verbalized understanding is amenable to undergoing the procedure.   Jaynie Collins, DO  [facility]  Portions of the record may have been created  with voice recognition software. Occasional wrong-word or 'sound-a-like' substitutions may have occurred due to the inherent limitations of voice recognition software.  Read the chart carefully and recognize, using context, where substitutions may have occurred.

## 2021-04-07 NOTE — Anesthesia Procedure Notes (Signed)
Date/Time: 04/07/2021 12:53 PM Performed by: Tonia Ghent Pre-anesthesia Checklist: Patient identified, Emergency Drugs available, Suction available, Patient being monitored and Timeout performed Patient Re-evaluated:Patient Re-evaluated prior to induction Oxygen Delivery Method: Simple face mask Preoxygenation: Pre-oxygenation with 100% oxygen Induction Type: IV induction Placement Confirmation: positive ETCO2 and CO2 detector

## 2021-04-07 NOTE — Op Note (Signed)
[facility] Gastroenterology Patient Name: Sara Hebert Procedure Date: 04/07/2021 12:47 PM MRN: [id number] Account #: [id number] Date of Birth: 03/17/1961 Admit Type: Outpatient Age: 60 Room: [facility] ENDO ROOM 2 Gender: Female Note Status: Finalized Instrument Name: Park Meo [id number] Procedure:             Colonoscopy Indications:           Colon cancer screening in patient at increased risk:                         Family history of 1st-degree relative with colon                         polyps before age 40 years Providers:             Rueben Bash, DO Referring MD:          Gladstone Lighter, MD (Referring MD) Medicines:             Monitored Anesthesia Care Complications:         No immediate complications. Estimated blood loss: None. Procedure:             Pre-Anesthesia Assessment:                        - Prior to the procedure, a History and Physical was                         performed, and patient medications and allergies were                         reviewed. The patient is competent. The risks and                         benefits of the procedure and the sedation options and                         risks were discussed with the patient. All questions                         were answered and informed consent was obtained.                         Patient identification and proposed procedure were                         verified by the physician, the nurse, the anesthetist                         and the technician in the endoscopy suite. Mental                         Status Examination: alert and oriented. Airway                         Examination: normal oropharyngeal airway and neck                         mobility. Respiratory Examination: clear to  auscultation. CV Examination: RRR, no murmurs, no S3                         or S4. Prophylactic Antibiotics: The patient does not                         require  prophylactic antibiotics. Prior                         Anticoagulants: The patient has taken no previous                         anticoagulant or antiplatelet agents. ASA Grade                         Assessment: II - A patient with mild systemic disease.                         After reviewing the risks and benefits, the patient                         was deemed in satisfactory condition to undergo the                         procedure. The anesthesia plan was to use monitored                         anesthesia care (MAC). Immediately prior to                         administration of medications, the patient was                         re-assessed for adequacy to receive sedatives. The                         heart rate, respiratory rate, oxygen saturations,                         blood pressure, adequacy of pulmonary ventilation, and                         response to care were monitored throughout the                         procedure. The physical status of the patient was                         re-assessed after the procedure.                        After obtaining informed consent, the colonoscope was                         passed under direct vision. Throughout the procedure,                         the patient's blood pressure, pulse, and oxygen  saturations were monitored continuously. The                         Colonoscope was introduced through the anus and                         advanced to the the terminal ileum, with                         identification of the appendiceal orifice and IC                         valve. The colonoscopy was performed without                         difficulty. The patient tolerated the procedure well.                         The quality of the bowel preparation was evaluated                         using the BBPS [facility] Bowel Preparation Scale) with                         scores of: Right Colon = 3, Transverse  Colon = 3 and                         Left Colon = 3 (entire mucosa seen well with no                         residual staining, small fragments of stool or opaque                         liquid). The total BBPS score equals 9. The terminal                         ileum, ileocecal valve, appendiceal orifice, and                         rectum were photographed. Findings:      Hemorrhoids were found on perianal exam.      Non-bleeding external and internal hemorrhoids were found during       retroflexion. Estimated blood loss: none.      The terminal ileum appeared normal.      Multiple small-mouthed diverticula were found in the left colon.       Estimated blood loss: none.      The exam was otherwise without abnormality on direct and retroflexion       views. Impression:            - Hemorrhoids found on perianal exam.                        - Non-bleeding external and internal hemorrhoids.                        - The examined portion of the ileum was normal.                        -  Diverticulosis in the left colon.                        - The examination was otherwise normal on direct and                         retroflexion views.                        - No specimens collected. Recommendation:        - Discharge patient to home.                        - Resume previous diet.                        - Use original regular Metamucil one tablespoon PO                         daily.                        - Continue present medications.                        - Repeat colonoscopy in 5 years for surveillance.                        - Return to referring physician as previously                         scheduled. Procedure Code(s):     --- Professional ---                        [phone], Colonoscopy, flexible; diagnostic, including                         collection of specimen(s) by brushing or washing, when                         performed (separate procedure) Diagnosis Code(s):      --- Professional ---                        Z83.71, Family history of colonic polyps                        K64.8, Other hemorrhoids                        K57.30, Diverticulosis of large intestine without                         perforation or abscess without bleeding CPT copyright 2019 American Medical Association. All rights reserved. The codes documented in this report are preliminary and upon coder review may  be revised to meet current compliance requirements. Attending Participation:      I personally performed the entire procedure. Volney American, DO Annamaria Helling DO, DO 04/07/2021 1:22:30 PM This report has been signed electronically. Number of Addenda: 0 Note Initiated On: 04/07/2021 12:47 PM Scope Withdrawal Time: 0 hours 16 minutes 33 seconds  Total Procedure  Duration: 0 hours 24 minutes 49 seconds  Estimated Blood Loss:  Estimated blood loss: none.      [employer]

## 2021-04-07 NOTE — Interval H&P Note (Signed)
History and Physical Interval Note: Preprocedure H&P from 04/07/21  was reviewed and there was no interval change after seeing and examining the patient.  Written consent was obtained from the patient after discussion of risks, benefits, and alternatives. Patient has consented to proceed with Colonoscopy with possible intervention   04/07/2021 12:38 PM  Sara Hebert  has presented today for surgery, with the diagnosis of Colon cancer screening (Z12.11) Personal history of colonic polyps (Z86.010) Family history of polyps in the colon (Z83.71) Family history of colon cancer (Z80.0).  The various methods of treatment have been discussed with the patient and family. After consideration of risks, benefits and other options for treatment, the patient has consented to  Procedure(s) with comments: COLONOSCOPY WITH PROPOFOL (N/A) - DM as a surgical intervention.  The patient's history has been reviewed, patient examined, no change in status, stable for surgery.  I have reviewed the patient's chart and labs.  Questions were answered to the patient's satisfaction.     Jaynie Collins

## 2021-04-07 NOTE — Transfer of Care (Signed)
Immediate Anesthesia Transfer of Care Note  Patient: Sara Hebert  Procedure(s) Performed: COLONOSCOPY WITH PROPOFOL  Patient Location: PACU  Anesthesia Type:General  Level of Consciousness: awake and sedated  Airway & Oxygen Therapy: Patient Spontanous Breathing and Patient connected to face mask oxygen  Post-op Assessment: Report given to RN and Post -op Vital signs reviewed and stable  Post vital signs: Reviewed and stable  Last Vitals:  Vitals Value Taken Time  BP    Temp    Pulse    Resp    SpO2      Last Pain:  Vitals:   04/07/21 1231  TempSrc: Temporal  PainSc: 0-No pain         Complications: No notable events documented.

## 2021-04-08 ENCOUNTER — Encounter: Payer: Self-pay | Admitting: Gastroenterology

## 2021-04-09 NOTE — Anesthesia Postprocedure Evaluation (Signed)
Anesthesia Post Note  Patient: Sara Hebert  Procedure(s) Performed: COLONOSCOPY WITH PROPOFOL  Patient location during evaluation: PACU Anesthesia Type: General Level of consciousness: awake and alert Pain management: pain level controlled Vital Signs Assessment: post-procedure vital signs reviewed and stable Respiratory status: spontaneous breathing, nonlabored ventilation, respiratory function stable and patient connected to nasal cannula oxygen Cardiovascular status: blood pressure returned to baseline and stable Postop Assessment: no apparent nausea or vomiting Anesthetic complications: no   No notable events documented.   Last Vitals:  Vitals:   04/07/21 1331 04/07/21 1341  BP: 132/78 133/82  Pulse: 90 76  Resp: 15 11  Temp:    SpO2: 100% 100%    Last Pain:  Vitals:   04/07/21 1341  TempSrc:   PainSc: 0-No pain                 Yevette Edwards

## 2021-04-09 NOTE — Anesthesia Preprocedure Evaluation (Signed)
Anesthesia Evaluation  Patient identified by MRN, date of birth, ID band Patient awake    Reviewed: Allergy & Precautions, H&P , NPO status , Patient's Chart, lab work & pertinent test results, reviewed documented beta blocker date and time   Airway Mallampati: II   Neck ROM: full    Dental  (+) Poor Dentition   Pulmonary neg pulmonary ROS,    Pulmonary exam normal        Cardiovascular Exercise Tolerance: Good hypertension, On Medications negative cardio ROS Normal cardiovascular exam Rhythm:regular Rate:Normal     Neuro/Psych Anxiety Depression negative neurological ROS  negative psych ROS   GI/Hepatic Neg liver ROS, GERD  Medicated,  Endo/Other  Hypothyroidism   Renal/GU Renal disease  negative genitourinary   Musculoskeletal   Abdominal   Peds  Hematology negative hematology ROS (+)   Anesthesia Other Findings Past Medical History: No date: Allergy No date: Anxiety No date: Arthritis No date: Chronic kidney disease     Comment:  early stages. creatnine recently was normal No date: Depression No date: GERD (gastroesophageal reflux disease) No date: Hypertension No date: Hypothyroidism No date: Kidney cysts No date: Thyroid disease Past Surgical History: No date: ABDOMINAL HYSTERECTOMY 1997: BREAST BIOPSY     Comment:  neg No date: CHOLECYSTECTOMY 04/07/2021: COLONOSCOPY WITH PROPOFOL; N/A     Comment:  Procedure: COLONOSCOPY WITH PROPOFOL;  Surgeon: Jaynie Collins, DO;  Location: [facility] ENDOSCOPY;  Service:               Gastroenterology;  Laterality: N/A;  DM No date: JOINT REPLACEMENT 08/03/2019: TOTAL KNEE ARTHROPLASTY; Right     Comment:  Procedure: RIGHT TOTAL KNEE ARTHROPLASTY;  Surgeon:               Kennedy Bucker, MD;  Location: [facility] ORS;  Service:               Orthopedics;  Laterality: Right; 08/01/2020: TOTAL KNEE ARTHROPLASTY; Left     Comment:  Procedure: TOTAL KNEE  ARTHROPLASTY - [place] to               Assist;  Surgeon: Kennedy Bucker, MD;  Location: [facility] ORS;              Service: Orthopedics;  Laterality: Left; BMI    Body Mass Index: 33.28 kg/m     Reproductive/Obstetrics negative OB ROS                             Anesthesia Physical Anesthesia Plan  ASA: 2  Anesthesia Plan: General   Post-op Pain Management:    Induction:   PONV Risk Score and Plan:   Airway Management Planned:   Additional Equipment:   Intra-op Plan:   Post-operative Plan:   Informed Consent: I have reviewed the patients History and Physical, chart, labs and discussed the procedure including the risks, benefits and alternatives for the proposed anesthesia with the patient or authorized representative who has indicated his/her understanding and acceptance.     Dental Advisory Given  Plan Discussed with: CRNA  Anesthesia Plan Comments:         Anesthesia Quick Evaluation

## 2021-04-11 ENCOUNTER — Other Ambulatory Visit: Payer: Self-pay

## 2021-05-13 ENCOUNTER — Other Ambulatory Visit: Payer: Self-pay

## 2021-05-14 ENCOUNTER — Other Ambulatory Visit: Payer: Self-pay

## 2021-05-14 MED ORDER — BUPROPION HCL ER (XL) 300 MG PO TB24
ORAL_TABLET | ORAL | 1 refills | Status: AC
  Filled 2021-05-14: qty 90, 90d supply, fill #0
  Filled 2021-08-19: qty 90, 90d supply, fill #1

## 2021-05-16 ENCOUNTER — Other Ambulatory Visit: Payer: Self-pay | Admitting: Physician Assistant

## 2021-05-16 ENCOUNTER — Ambulatory Visit
Admission: RE | Admit: 2021-05-16 | Discharge: 2021-05-16 | Disposition: A | Discharge status: Home / Self Care | Payer: No Typology Code available for payment source | Attending: Physician Assistant | Admitting: Physician Assistant

## 2021-05-16 ENCOUNTER — Ambulatory Visit
Admission: RE | Admit: 2021-05-16 | Discharge: 2021-05-16 | Disposition: A | Discharge status: Home / Self Care | Payer: No Typology Code available for payment source | Source: Ambulatory Visit | Attending: Physician Assistant | Admitting: Physician Assistant

## 2021-05-16 DIAGNOSIS — T1490XA Injury, unspecified, initial encounter: Secondary | ICD-10-CM | POA: Insufficient documentation

## 2021-05-20 ENCOUNTER — Other Ambulatory Visit: Payer: Self-pay

## 2021-05-20 ENCOUNTER — Ambulatory Visit: Payer: No Typology Code available for payment source | Attending: Internal Medicine

## 2021-05-20 DIAGNOSIS — Z23 Encounter for immunization: Secondary | ICD-10-CM

## 2021-05-20 MED ORDER — PFIZER COVID-19 VAC BIVALENT 30 MCG/0.3ML IM SUSP
INTRAMUSCULAR | 0 refills | Status: AC
  Filled 2021-05-20: qty 0.3, 1d supply, fill #0

## 2021-05-20 NOTE — Progress Notes (Signed)
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 She was provided with Vaccine Information Sheet and instruction to access the V-Safe system.   Sara Hebert was instructed to call 911 with any severe reactions post vaccine: Difficulty breathing  Swelling of face and throat  A fast heartbeat  A bad rash all over body  Dizziness and weakness   Immunizations Administered     Name Date Dose VIS Date Route   Pfizer Covid-19 Vaccine Bivalent Booster 05/20/2021  3:40 PM 0.3 mL 01/01/2021 Intramuscular   Manufacturer: [employer], [employer]   Lot: [id number]   NDC: [address]-6      Drusilla Kanner, PharmD, MBA Clinical Acute Care Pharmacist

## 2021-05-26 ENCOUNTER — Other Ambulatory Visit: Payer: Self-pay

## 2021-05-26 MED ORDER — METHYLPREDNISOLONE 4 MG PO TBPK
ORAL_TABLET | ORAL | 0 refills | Status: AC
  Filled 2021-05-26: qty 21, 6d supply, fill #0

## 2021-05-26 MED ORDER — CYCLOBENZAPRINE HCL 10 MG PO TABS
ORAL_TABLET | ORAL | 0 refills | Status: AC
  Filled 2021-05-26: qty 30, 30d supply, fill #0

## 2021-06-11 ENCOUNTER — Other Ambulatory Visit: Payer: Self-pay

## 2021-07-02 ENCOUNTER — Other Ambulatory Visit: Payer: Self-pay

## 2021-07-09 ENCOUNTER — Other Ambulatory Visit: Payer: Self-pay

## 2021-08-19 ENCOUNTER — Other Ambulatory Visit: Payer: Self-pay

## 2021-08-20 ENCOUNTER — Other Ambulatory Visit: Payer: Self-pay

## 2021-08-21 ENCOUNTER — Other Ambulatory Visit: Payer: Self-pay

## 2021-08-21 MED ORDER — LEVOTHYROXINE SODIUM 150 MCG PO TABS
ORAL_TABLET | ORAL | 1 refills | Status: AC
  Filled 2021-08-21: qty 90, 90d supply, fill #0

## 2021-08-21 MED ORDER — SAXENDA 18 MG/3ML ~~LOC~~ SOPN
PEN_INJECTOR | SUBCUTANEOUS | 3 refills | Status: AC
  Filled 2021-08-21: qty 15, 30d supply, fill #0
  Filled 2021-10-16: qty 15, 30d supply, fill #1
  Filled 2021-12-30: qty 15, 30d supply, fill #2

## 2021-09-02 ENCOUNTER — Emergency Department
Admission: EM | Admit: 2021-09-02 | Discharge: 2021-09-02 | Disposition: A | Discharge status: Home / Self Care | Payer: No Typology Code available for payment source | Attending: Emergency Medicine | Admitting: Emergency Medicine

## 2021-09-02 ENCOUNTER — Other Ambulatory Visit: Payer: Self-pay

## 2021-09-02 ENCOUNTER — Emergency Department: Payer: No Typology Code available for payment source

## 2021-09-02 DIAGNOSIS — R202 Paresthesia of skin: Secondary | ICD-10-CM | POA: Diagnosis not present

## 2021-09-02 DIAGNOSIS — E039 Hypothyroidism, unspecified: Secondary | ICD-10-CM | POA: Diagnosis not present

## 2021-09-02 DIAGNOSIS — Z96653 Presence of artificial knee joint, bilateral: Secondary | ICD-10-CM | POA: Diagnosis not present

## 2021-09-02 DIAGNOSIS — R079 Chest pain, unspecified: Secondary | ICD-10-CM

## 2021-09-02 DIAGNOSIS — R42 Dizziness and giddiness: Secondary | ICD-10-CM | POA: Diagnosis not present

## 2021-09-02 DIAGNOSIS — K219 Gastro-esophageal reflux disease without esophagitis: Secondary | ICD-10-CM | POA: Diagnosis not present

## 2021-09-02 DIAGNOSIS — I129 Hypertensive chronic kidney disease with stage 1 through stage 4 chronic kidney disease, or unspecified chronic kidney disease: Secondary | ICD-10-CM | POA: Diagnosis not present

## 2021-09-02 DIAGNOSIS — M549 Dorsalgia, unspecified: Secondary | ICD-10-CM | POA: Insufficient documentation

## 2021-09-02 DIAGNOSIS — R112 Nausea with vomiting, unspecified: Secondary | ICD-10-CM | POA: Insufficient documentation

## 2021-09-02 DIAGNOSIS — R0789 Other chest pain: Secondary | ICD-10-CM | POA: Diagnosis not present

## 2021-09-02 DIAGNOSIS — R1013 Epigastric pain: Secondary | ICD-10-CM | POA: Diagnosis not present

## 2021-09-02 DIAGNOSIS — R61 Generalized hyperhidrosis: Secondary | ICD-10-CM | POA: Diagnosis not present

## 2021-09-02 DIAGNOSIS — N1831 Chronic kidney disease, stage 3a: Secondary | ICD-10-CM | POA: Diagnosis not present

## 2021-09-02 DIAGNOSIS — R52 Pain, unspecified: Secondary | ICD-10-CM | POA: Diagnosis not present

## 2021-09-02 DIAGNOSIS — R197 Diarrhea, unspecified: Secondary | ICD-10-CM | POA: Diagnosis not present

## 2021-09-02 LAB — TROPONIN I (HIGH SENSITIVITY)
Troponin I (High Sensitivity): 10 ng/L (ref <18)
Troponin I (High Sensitivity): 11 ng/L (ref <18)

## 2021-09-02 LAB — CBC WITH DIFFERENTIAL/PLATELET
Abs Immature Granulocytes: 0.06 10*3/uL (ref 0.00–0.07)
Basophils Absolute: 0 10*3/uL (ref 0.0–0.1)
Basophils Relative: 0 %
Eosinophils Absolute: 0.1 10*3/uL (ref 0.0–0.5)
Eosinophils Relative: 1 %
HCT: 43.6 % (ref 36.0–46.0)
Hemoglobin: 14.1 g/dL (ref 12.0–15.0)
Immature Granulocytes: 1 %
Lymphocytes Relative: 11 %
Lymphs Abs: 1 10*3/uL (ref 0.7–4.0)
MCH: 31.1 pg (ref 26.0–34.0)
MCHC: 32.3 g/dL (ref 30.0–36.0)
MCV: 96 fL (ref 80.0–100.0)
Monocytes Absolute: 0.4 10*3/uL (ref 0.1–1.0)
Monocytes Relative: 4 %
Neutro Abs: 7.3 10*3/uL (ref 1.7–7.7)
Neutrophils Relative %: 83 %
Platelets: 208 10*3/uL (ref 150–400)
RBC: 4.54 MIL/uL (ref 3.87–5.11)
RDW: 12.4 % (ref 11.5–15.5)
WBC: 8.8 10*3/uL (ref 4.0–10.5)
nRBC: 0 % (ref 0.0–0.2)

## 2021-09-02 LAB — COMPREHENSIVE METABOLIC PANEL
ALT: 36 U/L (ref 0–44)
AST: 48 U/L — ABNORMAL HIGH (ref 15–41)
Albumin: 4 g/dL (ref 3.5–5.0)
Alkaline Phosphatase: 86 U/L (ref 38–126)
Anion gap: 12 (ref 5–15)
BUN: 17 mg/dL (ref 6–20)
CO2: 23 mmol/L (ref 22–32)
Calcium: 9.4 mg/dL (ref 8.9–10.3)
Chloride: 105 mmol/L (ref 98–111)
Creatinine, Ser: 1.15 mg/dL — ABNORMAL HIGH (ref 0.44–1.00)
GFR, Estimated: 55 mL/min — ABNORMAL LOW (ref >60)
Glucose, Bld: 191 mg/dL — ABNORMAL HIGH (ref 70–99)
Potassium: 3.3 mmol/L — ABNORMAL LOW (ref 3.5–5.1)
Sodium: 140 mmol/L (ref 135–145)
Total Bilirubin: 0.9 mg/dL (ref 0.3–1.2)
Total Protein: 7 g/dL (ref 6.5–8.1)

## 2021-09-02 LAB — LIPASE, BLOOD: Lipase: 39 U/L (ref 11–51)

## 2021-09-02 MED ORDER — SODIUM CHLORIDE 0.9 % IV BOLUS
1000.0000 mL | Freq: Once | INTRAVENOUS | Status: AC
  Administered 2021-09-02: 1000 mL via INTRAVENOUS

## 2021-09-02 NOTE — ED Provider Notes (Signed)
? ?[facility] ?Provider Note ? ? ? Event Date/Time  ? First MD Initiated Contact with Patient 09/02/21 1609   ?  (approximate) ? ? ?History  ? ?Chest Pain ? ? ?HPI ? ?Sara Hebert is a 61 y.o. female with past medical history of hypertension, GERD, CKD and hypothyroidism who presents after an episode of nausea and epigastric discomfort.  Patient felt nausea this morning and felt like she had to have a bowel movement vomited at home.  She was then getting a pedicure around 2:30 PM when she initially felt some back discomfort then felt very hot lightheaded and like she needed to have a bowel movement.  At that time she had a tight feeling in the mid epigastrium that did not radiate.  She did have diarrhea.  After she came back from the bathroom continues to feel very hot and lightheaded and was hyperventilating feeling like it was difficult to catch her breath so she said to call 911.  Patient has no history of similar.  No history of cardiac disease no family history of cardiac disease.  Does have history of prior provoked blood clot in the setting of knee replacement is not on anticoagulation anymore.  Currently patient feels back to baseline and is asymptomatic. ? ?  ? ?Past Medical History:  ?Diagnosis Date  ? Allergy   ? Anxiety   ? Arthritis   ? Chronic kidney disease   ? early stages. creatnine recently was normal  ? Depression   ? GERD (gastroesophageal reflux disease)   ? Hypertension   ? Hypothyroidism   ? Kidney cysts   ? Thyroid disease   ? ? ?Patient Active Problem List  ? Diagnosis Date Noted  ? Acute kidney injury superimposed on CKD llla (HCC) 09/08/2020  ? Acute gastroenteritis 09/08/2020  ? Hypothyroidism 09/08/2020  ? Depression 09/08/2020  ? HTN (hypertension) 09/08/2020  ? S/P TKR (total knee replacement) using cement, left 08/01/2020  ? Status post total knee replacement using cement, right 08/03/2019  ? ? ? ?Physical Exam  ?Triage Vital Signs: ?ED Triage Vitals   ?Enc Vitals Group  ?   BP 09/02/21 1623 140/74  ?   Pulse Rate 09/02/21 1623 (!) 106  ?   Resp 09/02/21 1623 19  ?   Temp 09/02/21 1623 98.1 ?F (36.7 ?C)  ?   Temp Source 09/02/21 1623 Oral  ?   SpO2 09/02/21 1623 99 %  ?   Weight 09/02/21 1625 220 lb (99.8 kg)  ?   Height 09/02/21 1625 5\' 5"  (1.651 m)  ?   Head Circumference --   ?   Peak Flow --   ?   Pain Score 09/02/21 1624 2  ?   Pain Loc --   ?   Pain Edu? --   ?   Excl. in GC? --   ? ? ?Most recent vital signs: ?Vitals:  ? 09/02/21 1900 09/02/21 1930  ?BP: 130/86 126/80  ?Pulse: (!) 101 95  ?Resp: 18 16  ?Temp:    ?SpO2: 100% 99%  ? ? ? ?General: Awake, no distress.  ?CV:  Good peripheral perfusion. No edema ?Resp:  Normal effort.  ?Abd:  No distention.  ?Neuro:             Awake, Alert, Oriented x 3  ?Other:  Mild tenderness to deep palpation in the center epigastrium ? ? ?ED Results / Procedures / Treatments  ?Labs ?(all labs ordered are listed, but  only abnormal results are displayed) ?Labs Reviewed  ?COMPREHENSIVE METABOLIC PANEL - Abnormal; Notable for the following components:  ?    Result Value  ? Potassium 3.3 (*)   ? Glucose, Bld 191 (*)   ? Creatinine, Ser 1.15 (*)   ? AST 48 (*)   ? GFR, Estimated 55 (*)   ? All other components within normal limits  ?CBC WITH DIFFERENTIAL/PLATELET  ?LIPASE, BLOOD  ?TROPONIN I (HIGH SENSITIVITY)  ?TROPONIN I (HIGH SENSITIVITY)  ? ? ? ?EKG ? ?EKG interpreted by myself shows sinus tachycardia with normal axis normal intervals, ST depression in lead V4 through V6 and II 3 and aVF, appears similar to prior EKG ? ? ?RADIOLOGY ? ? ? ?PROCEDURES: ? ?Critical Care performed: No ? ?.1-3 Lead EKG Interpretation ?Performed by: Georga Hacking, MD ?Authorized by: Georga Hacking, MD  ? ?  Interpretation: abnormal   ?  ECG rate assessment: tachycardic   ?  Rhythm: sinus tachycardia   ?  Ectopy: none   ?  Conduction: normal   ? ?The patient is on the cardiac monitor to evaluate for evidence of arrhythmia and/or  significant heart rate changes. ? ? ?MEDICATIONS ORDERED IN ED: ?Medications  ?sodium chloride 0.9 % bolus 1,000 mL (0 mLs Intravenous Stopped 09/02/21 1803)  ? ? ? ?IMPRESSION / MDM / ASSESSMENT AND PLAN / ED COURSE  ?I reviewed the triage vital signs and the nursing notes. ?             ?              ? ?Differential diagnosis includes, but is not limited to, ACS, vagal episode, gastroenteritis, less likely PE ? ?This patient is a 61 year old female history of hypertension hypothyroidism prior provoked PE no longer on anticoagulation who presents after an episode of epigastric/chest discomfort and diaphoresis.  Occurred while she was getting a pedicure.  Came on suddenly with very hot feeling and a tight feeling in the epigastrium associated with shortness of breath.  Felt lightheaded and had a bowel movement at the salon.  Continued to feel quite hot and short of breath so called 911.  Patient vitals are notable for sinus tachycardia with heart rates in the 1 teens.  She is currently asymptomatic and appears well.  Her EKG has some ST depression in leads V4 through V6 as well as the inferior leads, on review of prior EKG this appears similar although may be the depression is slightly more apparent today.  Question whether this was a vasovagal episode but certainly ACS needs to be ruled out.  We will check cardiac enzymes give her a liter of fluid and reassess. ? ?Patient's troponins x2 are negative.  I repeated the EKG and there is less artifact and it looks very much similar to prior EKG.  She continues to be asymptomatic.  Given her reassuring work-up I think she is appropriate for discharge with outpatient follow-up.  Discussed return precautions. ?  ? ? ?FINAL CLINICAL IMPRESSION(S) / ED DIAGNOSES  ? ?Final diagnoses:  ?Chest pain, unspecified type  ? ? ? ?Rx / DC Orders  ? ?ED Discharge Orders   ? ? None  ? ?  ? ? ? ?Note:  This document was prepared using Dragon voice recognition software and may include  unintentional dictation errors. ?  ?Georga Hacking, MD ?09/03/21 9735 ? ?

## 2021-09-02 NOTE — ED Notes (Signed)
E-signature pad unavailable - Pt verbalized understanding of D/C information - no additional concerns at this time.  

## 2021-09-02 NOTE — Discharge Instructions (Signed)
Your cardiac enzymes and EKG were reassuring today.  If you have recurrent symptoms, please return to the emergency department. ?

## 2021-09-02 NOTE — ED Triage Notes (Signed)
Pt presents to ED with c/o of chest pain that started about 30 minutes ago while pt was getting a pedicure. Pt states pain started in epigastric area, pt denies any radiation of pain. Pt states pain like a pressure and also states she began very hot and diaphoretic. Pt states she also did become SOB. Pt denies SOB at this time. ? ?EMS gave 324 ASA and 2 nitros PTA. ? ?Pt states pain is currently 2/10. Pt is A&Ox4. Pt also mentions some back pain as well.  ?

## 2021-10-16 ENCOUNTER — Other Ambulatory Visit: Payer: Self-pay

## 2021-10-17 ENCOUNTER — Other Ambulatory Visit: Payer: Self-pay

## 2021-11-27 ENCOUNTER — Other Ambulatory Visit: Payer: Self-pay

## 2021-11-27 MED ORDER — AMOXICILLIN 500 MG PO CAPS
ORAL_CAPSULE | ORAL | 0 refills | Status: DC
  Filled 2021-11-27: qty 4, 1d supply, fill #0

## 2021-12-01 ENCOUNTER — Other Ambulatory Visit: Payer: Self-pay

## 2021-12-16 ENCOUNTER — Other Ambulatory Visit
Admission: RE | Admit: 2021-12-16 | Discharge: 2021-12-16 | Disposition: A | Discharge status: Home / Self Care | Payer: No Typology Code available for payment source | Source: Ambulatory Visit | Attending: Nephrology | Admitting: Nephrology

## 2021-12-16 DIAGNOSIS — N1831 Chronic kidney disease, stage 3a: Secondary | ICD-10-CM | POA: Insufficient documentation

## 2021-12-16 DIAGNOSIS — R809 Proteinuria, unspecified: Secondary | ICD-10-CM | POA: Insufficient documentation

## 2021-12-16 DIAGNOSIS — R8290 Unspecified abnormal findings in urine: Secondary | ICD-10-CM | POA: Diagnosis not present

## 2021-12-16 DIAGNOSIS — E876 Hypokalemia: Secondary | ICD-10-CM | POA: Diagnosis not present

## 2021-12-16 DIAGNOSIS — I129 Hypertensive chronic kidney disease with stage 1 through stage 4 chronic kidney disease, or unspecified chronic kidney disease: Secondary | ICD-10-CM | POA: Insufficient documentation

## 2021-12-16 DIAGNOSIS — R3129 Other microscopic hematuria: Secondary | ICD-10-CM | POA: Insufficient documentation

## 2021-12-16 LAB — COMPREHENSIVE METABOLIC PANEL
ALT: 31 U/L (ref 0–44)
AST: 29 U/L (ref 15–41)
Albumin: 4.8 g/dL (ref 3.5–5.0)
Alkaline Phosphatase: 94 U/L (ref 38–126)
Anion gap: 9 (ref 5–15)
BUN: 18 mg/dL (ref 6–20)
CO2: 28 mmol/L (ref 22–32)
Calcium: 9.4 mg/dL (ref 8.9–10.3)
Chloride: 103 mmol/L (ref 98–111)
Creatinine, Ser: 1.4 mg/dL — ABNORMAL HIGH (ref 0.44–1.00)
GFR, Estimated: 44 mL/min — ABNORMAL LOW (ref >60)
Glucose, Bld: 88 mg/dL (ref 70–99)
Potassium: 4 mmol/L (ref 3.5–5.1)
Sodium: 140 mmol/L (ref 135–145)
Total Bilirubin: 1.4 mg/dL — ABNORMAL HIGH (ref 0.3–1.2)
Total Protein: 8.3 g/dL — ABNORMAL HIGH (ref 6.5–8.1)

## 2021-12-16 LAB — URIC ACID: Uric Acid, Serum: 6.7 mg/dL (ref 2.5–7.1)

## 2021-12-16 LAB — HEPATITIS C ANTIBODY: HCV Ab: NONREACTIVE

## 2021-12-16 LAB — CBC
HCT: 42.6 % (ref 36.0–46.0)
Hemoglobin: 14.1 g/dL (ref 12.0–15.0)
MCH: 31.1 pg (ref 26.0–34.0)
MCHC: 33.1 g/dL (ref 30.0–36.0)
MCV: 94 fL (ref 80.0–100.0)
Platelets: 229 10*3/uL (ref 150–400)
RBC: 4.53 MIL/uL (ref 3.87–5.11)
RDW: 12.5 % (ref 11.5–15.5)
WBC: 6.4 10*3/uL (ref 4.0–10.5)
nRBC: 0 % (ref 0.0–0.2)

## 2021-12-16 LAB — HIV ANTIBODY (ROUTINE TESTING W REFLEX): HIV Screen 4th Generation wRfx: NONREACTIVE

## 2021-12-16 LAB — MAGNESIUM: Magnesium: 2.5 mg/dL — ABNORMAL HIGH (ref 1.7–2.4)

## 2021-12-16 LAB — PHOSPHORUS: Phosphorus: 3 mg/dL (ref 2.5–4.6)

## 2021-12-16 LAB — HEPATITIS B SURFACE ANTIGEN: Hepatitis B Surface Ag: NONREACTIVE

## 2021-12-16 LAB — HEPATITIS B CORE ANTIBODY, TOTAL: Hep B Core Total Ab: NONREACTIVE

## 2021-12-16 LAB — CK: Total CK: 170 U/L (ref 38–234)

## 2021-12-17 LAB — MISC LABCORP TEST (SEND OUT): Labcorp test code: 141330

## 2021-12-17 LAB — ANTI-DNA ANTIBODY, DOUBLE-STRANDED: ds DNA Ab: <1 [IU]/mL (ref 0–9)

## 2021-12-17 LAB — C4 COMPLEMENT: Complement C4, Body Fluid: 51 mg/dL — ABNORMAL HIGH (ref 12–38)

## 2021-12-17 LAB — KAPPA/LAMBDA LIGHT CHAINS
Kappa free light chain: 14.4 mg/L (ref 3.3–19.4)
Kappa, lambda light chain ratio: 1.44 (ref 0.26–1.65)
Lambda free light chains: 10 mg/L (ref 5.7–26.3)

## 2021-12-17 LAB — C3 COMPLEMENT: C3 Complement: 156 mg/dL (ref 82–167)

## 2021-12-17 LAB — PARATHYROID HORMONE, INTACT (NO CA): PTH: 104 pg/mL — ABNORMAL HIGH (ref 15–65)

## 2021-12-17 LAB — ANA W/REFLEX: Anti Nuclear Antibody (ANA): NEGATIVE

## 2021-12-17 LAB — HEPATITIS B SURFACE ANTIBODY, QUANTITATIVE: Hep B S AB Quant (Post): 8.4 m[IU]/mL — ABNORMAL LOW (ref >9.9)

## 2021-12-18 LAB — ANCA PROFILE
Anti-MPO Antibodies: 0.3 units (ref 0.0–0.9)
Anti-PR3 Antibodies: <0.2 units (ref 0.0–0.9)
Atypical P-ANCA titer: <1:20 {titer}
C-ANCA: <1:20 {titer}
P-ANCA: <1:20 {titer}

## 2021-12-18 LAB — GLOMERULAR BASEMENT MEMBRANE ANTIBODIES: GBM Ab: <0.2 units (ref 0.0–0.9)

## 2021-12-19 LAB — PROTEIN ELECTROPHORESIS, SERUM
A/G Ratio: 1.3 (ref 0.7–1.7)
Albumin ELP: 4.1 g/dL (ref 2.9–4.4)
Alpha-1-Globulin: 0.2 g/dL (ref 0.0–0.4)
Alpha-2-Globulin: 0.7 g/dL (ref 0.4–1.0)
Beta Globulin: 1.2 g/dL (ref 0.7–1.3)
Gamma Globulin: 1 g/dL (ref 0.4–1.8)
Globulin, Total: 3.1 g/dL (ref 2.2–3.9)
Total Protein ELP: 7.2 g/dL (ref 6.0–8.5)

## 2021-12-30 ENCOUNTER — Other Ambulatory Visit: Payer: Self-pay

## 2021-12-30 MED ORDER — LISINOPRIL 10 MG PO TABS
ORAL_TABLET | ORAL | 3 refills | Status: DC
  Filled 2021-12-30: qty 90, 90d supply, fill #0
  Filled 2022-05-12: qty 90, 90d supply, fill #1

## 2022-01-01 ENCOUNTER — Other Ambulatory Visit: Payer: Self-pay | Admitting: Nephrology

## 2022-01-01 DIAGNOSIS — R8290 Unspecified abnormal findings in urine: Secondary | ICD-10-CM

## 2022-01-01 DIAGNOSIS — N1831 Chronic kidney disease, stage 3a: Secondary | ICD-10-CM

## 2022-01-01 DIAGNOSIS — R809 Proteinuria, unspecified: Secondary | ICD-10-CM

## 2022-01-01 DIAGNOSIS — E876 Hypokalemia: Secondary | ICD-10-CM

## 2022-01-01 DIAGNOSIS — R3129 Other microscopic hematuria: Secondary | ICD-10-CM

## 2022-01-01 DIAGNOSIS — I129 Hypertensive chronic kidney disease with stage 1 through stage 4 chronic kidney disease, or unspecified chronic kidney disease: Secondary | ICD-10-CM

## 2022-01-02 ENCOUNTER — Ambulatory Visit
Admission: RE | Admit: 2022-01-02 | Discharge: 2022-01-02 | Disposition: A | Discharge status: Home / Self Care | Payer: No Typology Code available for payment source | Source: Ambulatory Visit | Attending: Nephrology | Admitting: Nephrology

## 2022-01-02 DIAGNOSIS — I129 Hypertensive chronic kidney disease with stage 1 through stage 4 chronic kidney disease, or unspecified chronic kidney disease: Secondary | ICD-10-CM | POA: Insufficient documentation

## 2022-01-02 DIAGNOSIS — R809 Proteinuria, unspecified: Secondary | ICD-10-CM | POA: Diagnosis present

## 2022-01-02 DIAGNOSIS — R8290 Unspecified abnormal findings in urine: Secondary | ICD-10-CM | POA: Diagnosis present

## 2022-01-02 DIAGNOSIS — N1831 Chronic kidney disease, stage 3a: Secondary | ICD-10-CM | POA: Insufficient documentation

## 2022-01-02 DIAGNOSIS — R3129 Other microscopic hematuria: Secondary | ICD-10-CM | POA: Diagnosis present

## 2022-01-02 DIAGNOSIS — E876 Hypokalemia: Secondary | ICD-10-CM | POA: Insufficient documentation

## 2022-01-20 ENCOUNTER — Other Ambulatory Visit: Payer: Self-pay

## 2022-01-20 MED ORDER — AMOXICILLIN 500 MG PO CAPS
ORAL_CAPSULE | ORAL | 2 refills | Status: AC
  Filled 2022-01-20: qty 4, 1d supply, fill #0
  Filled 2022-04-08: qty 4, 1d supply, fill #1
  Filled 2022-11-19: qty 4, 1d supply, fill #2

## 2022-01-23 ENCOUNTER — Other Ambulatory Visit: Payer: Self-pay

## 2022-01-23 MED ORDER — ERGOCALCIFEROL 1.25 MG (50000 UT) PO CAPS
ORAL_CAPSULE | ORAL | 0 refills | Status: AC
  Filled 2022-01-23: qty 12, 84d supply, fill #0

## 2022-01-23 MED ORDER — CYCLOBENZAPRINE HCL 10 MG PO TABS
10.0000 mg | ORAL_TABLET | Freq: Two times a day (BID) | ORAL | 1 refills | Status: AC | PRN
  Filled 2022-01-23: qty 30, 15d supply, fill #0
  Filled 2022-04-08: qty 30, 15d supply, fill #1

## 2022-01-23 MED ORDER — WEGOVY 1 MG/0.5ML ~~LOC~~ SOAJ
1.0000 mg | SUBCUTANEOUS | 1 refills | Status: DC
  Filled 2022-01-23 – 2022-03-05 (×5): qty 2, 28d supply, fill #0
  Filled 2022-03-31: qty 2, 28d supply, fill #1

## 2022-01-23 MED ORDER — BUPROPION HCL ER (XL) 300 MG PO TB24
ORAL_TABLET | ORAL | 1 refills | Status: DC
  Filled 2022-01-23: qty 90, 90d supply, fill #0
  Filled 2022-05-12: qty 90, 90d supply, fill #1

## 2022-01-23 MED ORDER — LEVOTHYROXINE SODIUM 150 MCG PO TABS
ORAL_TABLET | ORAL | 1 refills | Status: DC
  Filled 2022-01-23: qty 90, 90d supply, fill #0
  Filled 2022-05-12: qty 90, 90d supply, fill #1

## 2022-01-30 ENCOUNTER — Other Ambulatory Visit: Payer: Self-pay

## 2022-02-03 ENCOUNTER — Other Ambulatory Visit: Payer: Self-pay

## 2022-02-05 ENCOUNTER — Other Ambulatory Visit: Payer: Self-pay | Admitting: Internal Medicine

## 2022-02-05 DIAGNOSIS — Z1231 Encounter for screening mammogram for malignant neoplasm of breast: Secondary | ICD-10-CM

## 2022-02-17 ENCOUNTER — Other Ambulatory Visit: Payer: Self-pay

## 2022-02-27 ENCOUNTER — Other Ambulatory Visit: Payer: Self-pay

## 2022-03-05 ENCOUNTER — Other Ambulatory Visit: Payer: Self-pay

## 2022-03-10 ENCOUNTER — Other Ambulatory Visit: Payer: Self-pay

## 2022-03-12 ENCOUNTER — Other Ambulatory Visit: Payer: Self-pay

## 2022-03-12 MED ORDER — ZOSTER VAC RECOMB ADJUVANTED 50 MCG/0.5ML IM SUSR
INTRAMUSCULAR | 0 refills | Status: AC
  Filled 2022-03-12: qty 1, 1d supply, fill #0

## 2022-03-13 ENCOUNTER — Other Ambulatory Visit: Payer: Self-pay

## 2022-03-16 ENCOUNTER — Other Ambulatory Visit: Payer: Self-pay

## 2022-03-27 ENCOUNTER — Other Ambulatory Visit: Payer: Self-pay

## 2022-04-01 ENCOUNTER — Other Ambulatory Visit: Payer: Self-pay

## 2022-04-08 ENCOUNTER — Other Ambulatory Visit: Payer: Self-pay

## 2022-04-15 IMAGING — MR MR ABDOMEN WO/W CM
18 series · 48 of 48 positions shown · IV contrast (9ml Gadavist)
Comparison: CT abdomen/pelvis dated 09/04/2020

CLINICAL DATA: Right suprarenal cyst on CT

EXAM:
MRI ABDOMEN WITHOUT AND WITH CONTRAST
TECHNIQUE: Multiplanar multisequence MR imaging of the abdomen was performed
both before and after the administration of intravenous contrast.
CONTRAST:  9mL GADAVIST GADOBUTROL 1 MMOL/ML IV SOLN

[Series 3: T2 · coronal · 6.0mm · 1.19mm/px · 2 of 30 slices shown (1 of 2)]
[im 1/30]
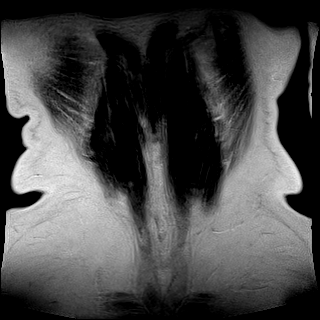
[im 30/30]
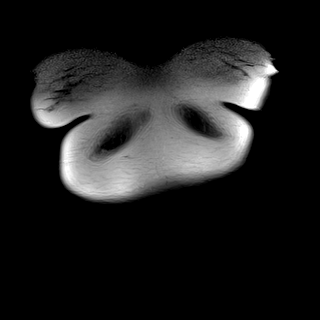

[Series 4: T2 · axial · 6.0mm · 1.19mm/px · z∈[-67,+177]mm · 2 of 35 slices shown (2 of 2)]
[im 1/35]
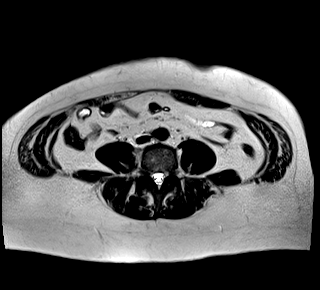
[im 35/35]
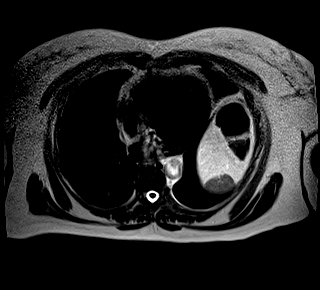

[Series 6: ax dwi_tracew · axial · 6.0mm · 1.42mm/px · z∈[-46,+192]mm · 5 of 102 slices shown]
[im 1/102]
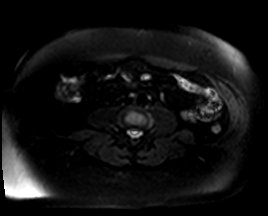
[im 26/102]
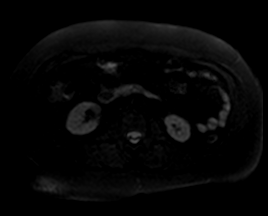
[im 51/102]
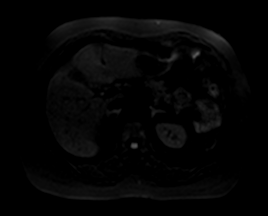
[im 76/102]
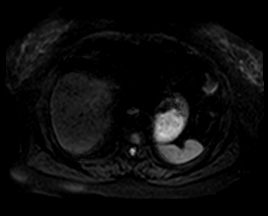
[im 102/102]
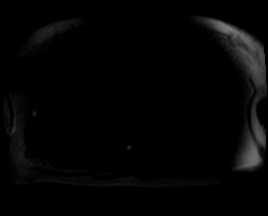

[Series 7: ax dwi_adc · axial · 6.0mm · 1.42mm/px · 1 of 34 slices shown]
[im 1/34]
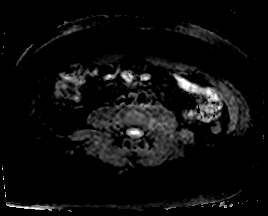

[Series 9: T2 fat-sat · axial · 6.0mm · 1.19mm/px · 1 of 34 slices shown]
[im 1/34]
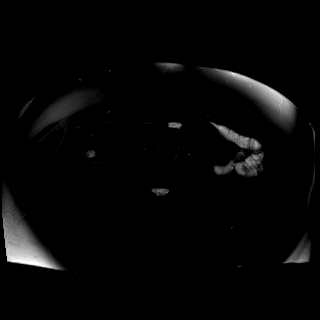

[Series 10: in & out · axial · 3.0mm · 1.19mm/px · z∈[-46,+190]mm · 3 of 80 slices shown (1 of 2)]
[im 1/80]
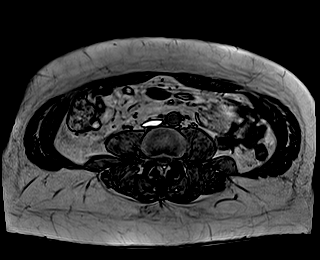
[im 40/80]
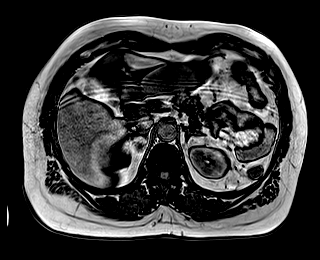
[im 80/80]
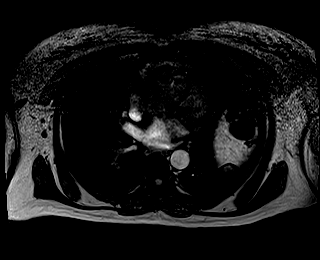

[Series 11: in & out · axial · 3.0mm · 1.19mm/px · z∈[-46,+190]mm · 3 of 80 slices shown (2 of 2)]
[im 1/80]
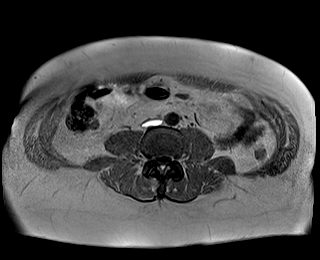
[im 40/80]
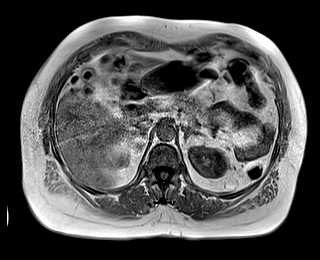
[im 80/80]
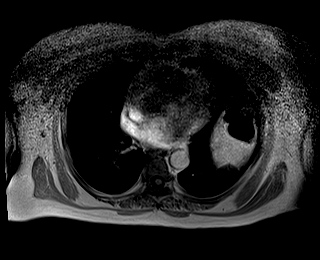

[Series 12: bSSFP · axial · 6.0mm · 0.74mm/px · 1 of 35 slices shown]
[im 1/35]
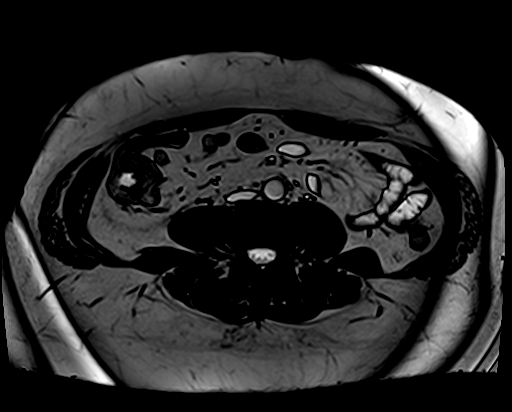

[Series 13: T1 dynamic fat-sat · axial · non-contrast · 3.0mm · 1.19mm/px · z∈[-58,+202]mm · 3 of 88 slices shown (1 of 5)]
[im 1/88]
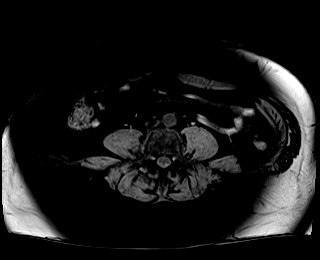
[im 44/88]
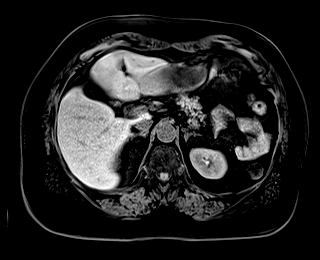
[im 88/88]
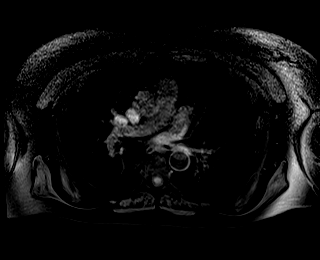

[Series 14: T1 dynamic fat-sat post-contrast · axial · 3.0mm · 1.19mm/px · z∈[-58,+202]mm · 3 of 88 slices shown (1 of 4)]
[im 1/88]
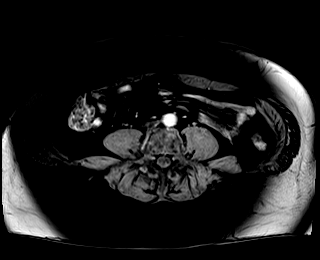
[im 44/88]
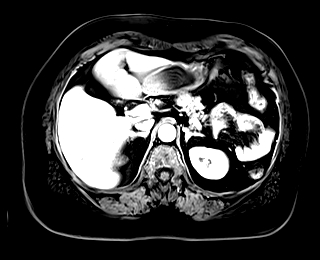
[im 88/88]
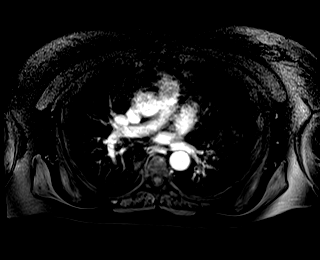

[Series 15: T1 dynamic fat-sat · axial · 3.0mm · 1.19mm/px · z∈[-58,+202]mm · 3 of 88 slices shown (2 of 5)]
[im 1/88]
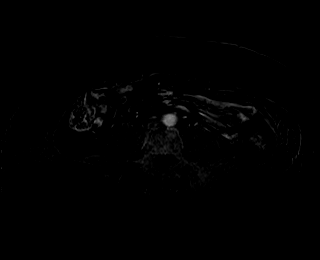
[im 44/88]
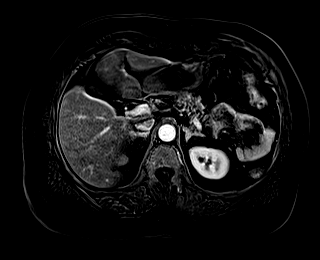
[im 88/88]
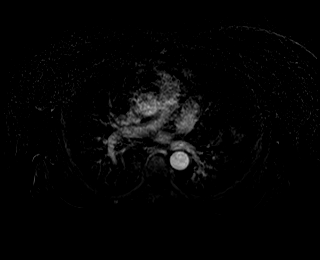

[Series 16: T1 dynamic fat-sat post-contrast · axial · 3.0mm · 1.19mm/px · z∈[-58,+202]mm · 3 of 88 slices shown (2 of 4)]
[im 1/88]
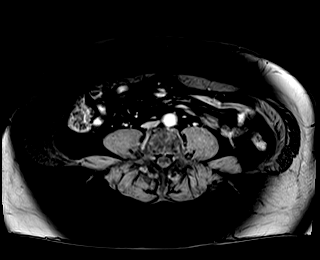
[im 44/88]
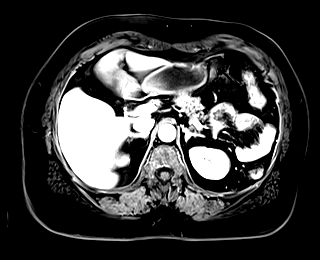
[im 88/88]
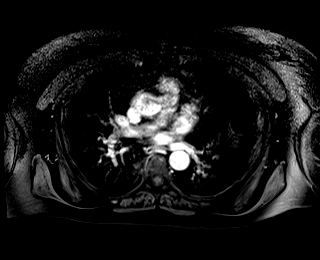

[Series 17: T1 dynamic fat-sat · axial · 3.0mm · 1.19mm/px · z∈[-58,+202]mm · 3 of 88 slices shown (3 of 5)]
[im 1/88]
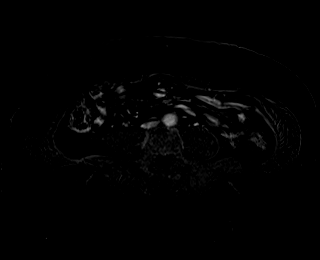
[im 44/88]
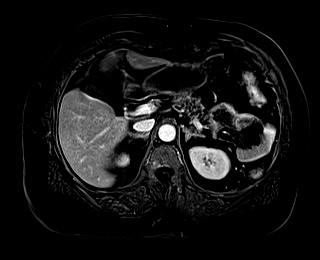
[im 88/88]
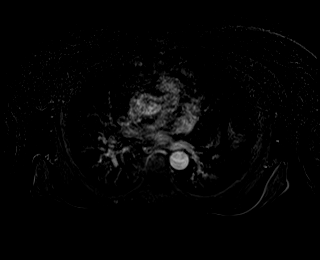

[Series 18: T1 dynamic fat-sat post-contrast · axial · 3.0mm · 1.19mm/px · z∈[-58,+202]mm · 3 of 88 slices shown (3 of 4)]
[im 1/88]
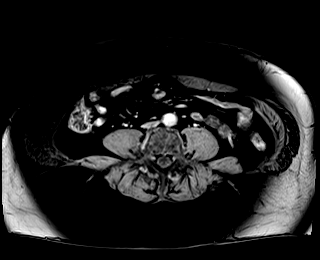
[im 44/88]
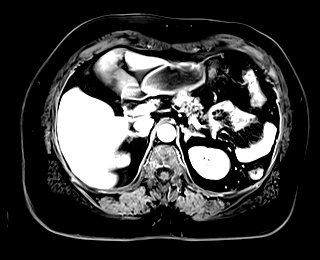
[im 88/88]
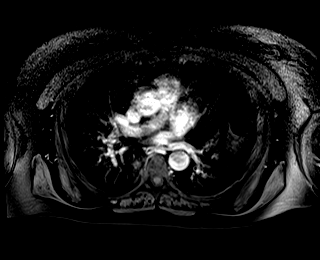

[Series 19: T1 dynamic fat-sat · axial · 3.0mm · 1.19mm/px · z∈[-58,+202]mm · 3 of 88 slices shown (4 of 5)]
[im 1/88]
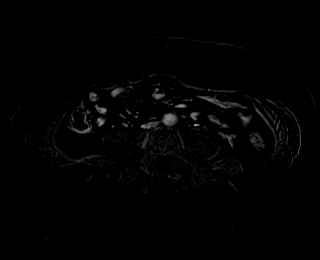
[im 44/88]
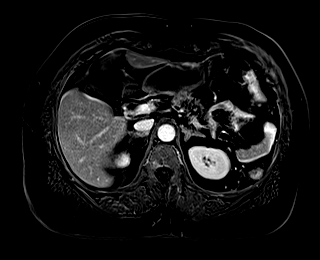
[im 88/88]
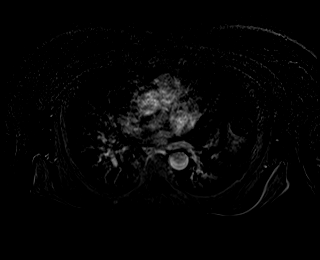

[Series 20: T1 dynamic post-contrast · coronal · 3.0mm · 1.31mm/px · 3 of 72 slices shown]
[im 1/72]
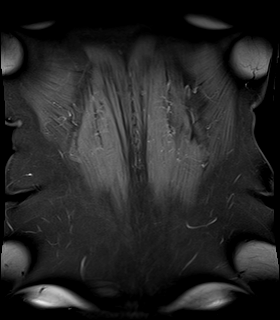
[im 36/72]
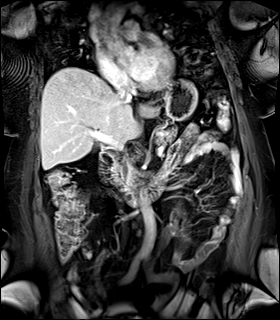
[im 72/72]
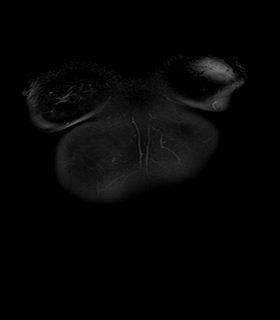

[Series 21: T1 dynamic fat-sat post-contrast · axial · 3.0mm · 1.19mm/px · z∈[-58,+202]mm · 3 of 88 slices shown (4 of 4)]
[im 1/88]
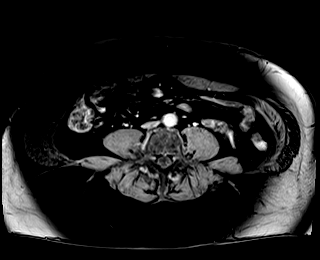
[im 44/88]
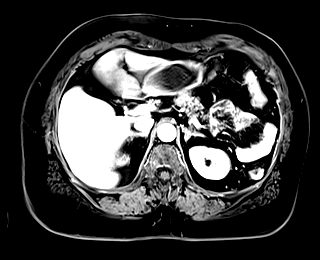
[im 88/88]
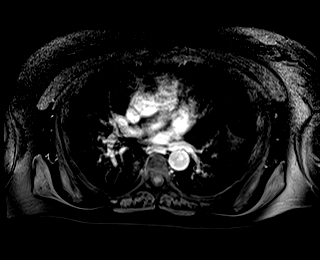

[Series 22: T1 dynamic fat-sat · axial · 3.0mm · 1.19mm/px · z∈[-58,+202]mm · 3 of 88 slices shown (5 of 5)]
[im 1/88]
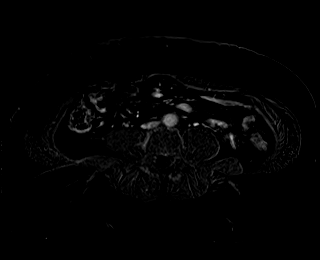
[im 44/88]
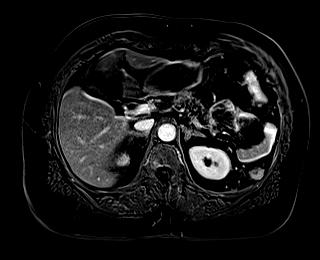
[im 88/88]
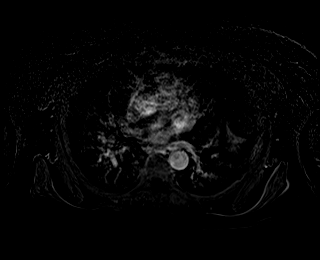

[48 of 48 positions shown; findings below may reference images not displayed]

FINDINGS: Lower chest: Lung bases are clear.

Hepatobiliary: Liver is within normal limits. No
suspicious/enhancing hepatic lesions.

Status post cholecystectomy. No intrahepatic or extrahepatic ductal
dilatation.

Pancreas:  Within normal limits.

Spleen:  Within normal limits.

Adrenals/Urinary Tract:  Adrenal glands are within normal limits.

Kidneys are within normal limits.  No hydronephrosis.

Stomach/Bowel: Stomach is within normal limits.

Left colonic diverticulosis, without evidence of diverticulitis.

Vascular/Lymphatic:  No evidence of abdominal aortic aneurysm.

No suspicious abdominal lymphadenopathy.

Other:  No abdominal ascites.

2.2 x 1.7 x 3.4 cm unilocular cystic lesion in the right medial
pararenal space (series 9/image 21). This abuts the medial right
upper kidney on MR, but is remote/distinct from the renal parenchyma
on CT. This remains distinct from the right adrenal gland. No
enhancement following contrast administration. No complexity or
solid component. This benign lesion is likely best characterized as
a retroperitoneal cyst.

Musculoskeletal: No focal osseous lesions.
IMPRESSION: 3.4 cm right retroperitoneal cyst in the right medial pararenal
space, corresponding to the CT abnormality, benign. No further
imaging is suggested.

## 2022-04-28 ENCOUNTER — Other Ambulatory Visit: Payer: Self-pay

## 2022-04-29 ENCOUNTER — Other Ambulatory Visit: Payer: Self-pay

## 2022-04-30 ENCOUNTER — Other Ambulatory Visit: Payer: Self-pay

## 2022-04-30 MED ORDER — WEGOVY 1 MG/0.5ML ~~LOC~~ SOAJ
SUBCUTANEOUS | 1 refills | Status: AC
  Filled 2022-04-30: qty 2, 28d supply, fill #0
  Filled 2022-06-01: qty 2, 28d supply, fill #1

## 2022-05-01 ENCOUNTER — Other Ambulatory Visit: Payer: Self-pay

## 2022-05-12 ENCOUNTER — Other Ambulatory Visit: Payer: Self-pay

## 2022-05-12 ENCOUNTER — Ambulatory Visit
Admission: RE | Admit: 2022-05-12 | Discharge: 2022-05-12 | Disposition: A | Discharge status: Home / Self Care | Payer: 59 | Source: Ambulatory Visit | Attending: Internal Medicine | Admitting: Internal Medicine

## 2022-05-12 DIAGNOSIS — Z1231 Encounter for screening mammogram for malignant neoplasm of breast: Secondary | ICD-10-CM | POA: Diagnosis not present

## 2022-05-14 ENCOUNTER — Other Ambulatory Visit: Payer: Self-pay

## 2022-05-14 MED ORDER — CITALOPRAM HYDROBROMIDE 40 MG PO TABS
40.0000 mg | ORAL_TABLET | Freq: Every day | ORAL | 1 refills | Status: DC
  Filled 2022-05-14: qty 90, 90d supply, fill #0
  Filled 2022-08-20: qty 90, 90d supply, fill #1

## 2022-05-22 ENCOUNTER — Other Ambulatory Visit: Payer: Self-pay

## 2022-06-01 ENCOUNTER — Other Ambulatory Visit: Payer: Self-pay

## 2022-06-23 ENCOUNTER — Other Ambulatory Visit: Payer: Self-pay

## 2022-06-23 DIAGNOSIS — E669 Obesity, unspecified: Secondary | ICD-10-CM | POA: Diagnosis not present

## 2022-06-23 DIAGNOSIS — E039 Hypothyroidism, unspecified: Secondary | ICD-10-CM | POA: Diagnosis not present

## 2022-06-23 DIAGNOSIS — N2581 Secondary hyperparathyroidism of renal origin: Secondary | ICD-10-CM | POA: Diagnosis not present

## 2022-06-23 DIAGNOSIS — G8929 Other chronic pain: Secondary | ICD-10-CM | POA: Diagnosis not present

## 2022-06-23 DIAGNOSIS — N183 Chronic kidney disease, stage 3 unspecified: Secondary | ICD-10-CM | POA: Diagnosis not present

## 2022-06-23 DIAGNOSIS — K648 Other hemorrhoids: Secondary | ICD-10-CM | POA: Diagnosis not present

## 2022-06-23 DIAGNOSIS — M25551 Pain in right hip: Secondary | ICD-10-CM | POA: Diagnosis not present

## 2022-06-23 DIAGNOSIS — R8290 Unspecified abnormal findings in urine: Secondary | ICD-10-CM | POA: Diagnosis not present

## 2022-06-23 DIAGNOSIS — I10 Essential (primary) hypertension: Secondary | ICD-10-CM | POA: Diagnosis not present

## 2022-06-23 DIAGNOSIS — M1712 Unilateral primary osteoarthritis, left knee: Secondary | ICD-10-CM | POA: Diagnosis not present

## 2022-06-23 MED ORDER — METHYLPREDNISOLONE 4 MG PO TBPK
ORAL_TABLET | ORAL | 0 refills | Status: AC
  Filled 2022-06-23: qty 21, 6d supply, fill #0

## 2022-06-23 MED ORDER — HYDROCORTISONE ACETATE 25 MG RE SUPP
25.0000 mg | Freq: Two times a day (BID) | RECTAL | 0 refills | Status: AC
Start: 2022-06-23 — End: ?
  Filled 2022-06-23: qty 20, 10d supply, fill #0

## 2022-06-23 MED ORDER — WEGOVY 1.7 MG/0.75ML ~~LOC~~ SOAJ
1.7000 mg | SUBCUTANEOUS | 2 refills | Status: DC
  Filled 2022-06-23: qty 3, 28d supply, fill #0
  Filled 2022-07-27: qty 3, 28d supply, fill #1
  Filled 2022-08-20: qty 3, 28d supply, fill #2

## 2022-06-24 ENCOUNTER — Other Ambulatory Visit: Payer: Self-pay

## 2022-06-24 DIAGNOSIS — R8290 Unspecified abnormal findings in urine: Secondary | ICD-10-CM | POA: Diagnosis not present

## 2022-06-25 ENCOUNTER — Other Ambulatory Visit: Payer: Self-pay

## 2022-07-06 ENCOUNTER — Other Ambulatory Visit: Payer: Self-pay

## 2022-07-06 DIAGNOSIS — I129 Hypertensive chronic kidney disease with stage 1 through stage 4 chronic kidney disease, or unspecified chronic kidney disease: Secondary | ICD-10-CM | POA: Diagnosis not present

## 2022-07-06 DIAGNOSIS — N1832 Chronic kidney disease, stage 3b: Secondary | ICD-10-CM | POA: Diagnosis not present

## 2022-07-06 DIAGNOSIS — N2581 Secondary hyperparathyroidism of renal origin: Secondary | ICD-10-CM | POA: Diagnosis not present

## 2022-07-06 MED ORDER — LISINOPRIL 20 MG PO TABS
20.0000 mg | ORAL_TABLET | Freq: Every day | ORAL | 3 refills | Status: DC
  Filled 2022-07-06 (×2): qty 60, 60d supply, fill #0
  Filled 2022-07-06: qty 30, 30d supply, fill #0

## 2022-07-21 ENCOUNTER — Other Ambulatory Visit: Payer: Self-pay

## 2022-07-27 ENCOUNTER — Other Ambulatory Visit: Payer: Self-pay

## 2022-08-20 ENCOUNTER — Other Ambulatory Visit: Payer: Self-pay

## 2022-08-21 ENCOUNTER — Other Ambulatory Visit: Payer: Self-pay

## 2022-08-21 MED ORDER — LEVOTHYROXINE SODIUM 150 MCG PO TABS
150.0000 ug | ORAL_TABLET | Freq: Every day | ORAL | 1 refills | Status: DC
  Filled 2022-08-21: qty 90, 90d supply, fill #0
  Filled 2022-11-19: qty 90, 90d supply, fill #1

## 2022-08-21 MED ORDER — BUPROPION HCL ER (XL) 300 MG PO TB24
300.0000 mg | ORAL_TABLET | Freq: Every day | ORAL | 1 refills | Status: AC
  Filled 2022-08-21: qty 90, 90d supply, fill #0
  Filled 2022-11-19: qty 90, 90d supply, fill #1

## 2022-09-02 ENCOUNTER — Other Ambulatory Visit: Payer: Self-pay

## 2022-09-02 MED ORDER — AMLODIPINE BESYLATE 5 MG PO TABS
5.0000 mg | ORAL_TABLET | Freq: Every day | ORAL | 3 refills | Status: AC
  Filled 2022-09-02: qty 90, 90d supply, fill #0
  Filled 2022-11-19: qty 90, 90d supply, fill #1

## 2022-09-25 ENCOUNTER — Other Ambulatory Visit: Payer: Self-pay

## 2022-09-25 MED ORDER — DIAZEPAM 2 MG PO TABS
2.0000 mg | ORAL_TABLET | Freq: Every day | ORAL | 0 refills | Status: AC
  Filled 2022-09-25: qty 2, 2d supply, fill #0

## 2022-10-20 ENCOUNTER — Other Ambulatory Visit: Payer: Self-pay

## 2022-10-20 DIAGNOSIS — E669 Obesity, unspecified: Secondary | ICD-10-CM | POA: Diagnosis not present

## 2022-10-20 DIAGNOSIS — I10 Essential (primary) hypertension: Secondary | ICD-10-CM | POA: Diagnosis not present

## 2022-10-20 DIAGNOSIS — M7072 Other bursitis of hip, left hip: Secondary | ICD-10-CM | POA: Diagnosis not present

## 2022-10-20 DIAGNOSIS — E039 Hypothyroidism, unspecified: Secondary | ICD-10-CM | POA: Diagnosis not present

## 2022-10-20 MED ORDER — PHENTERMINE HCL 15 MG PO CAPS
15.0000 mg | ORAL_CAPSULE | ORAL | 0 refills | Status: DC
  Filled 2022-10-20 (×2): qty 30, 30d supply, fill #0

## 2022-10-23 ENCOUNTER — Other Ambulatory Visit (HOSPITAL_COMMUNITY): Payer: Self-pay

## 2022-11-17 ENCOUNTER — Other Ambulatory Visit: Payer: Self-pay

## 2022-11-17 MED ORDER — PHENTERMINE HCL 37.5 MG PO CAPS
37.5000 mg | ORAL_CAPSULE | Freq: Every morning | ORAL | 0 refills | Status: AC
  Filled 2022-11-17: qty 30, 30d supply, fill #0

## 2022-11-19 ENCOUNTER — Other Ambulatory Visit: Payer: Self-pay

## 2022-11-19 MED ORDER — CITALOPRAM HYDROBROMIDE 40 MG PO TABS
40.0000 mg | ORAL_TABLET | Freq: Every day | ORAL | 1 refills | Status: AC
  Filled 2022-11-19: qty 90, 90d supply, fill #0

## 2022-12-18 ENCOUNTER — Other Ambulatory Visit: Payer: Self-pay

## 2022-12-21 ENCOUNTER — Other Ambulatory Visit: Payer: Self-pay

## 2022-12-21 MED ORDER — ZEPBOUND 2.5 MG/0.5ML ~~LOC~~ SOAJ
2.5000 mg | SUBCUTANEOUS | 1 refills | Status: DC
  Filled 2022-12-21 – 2022-12-22 (×2): qty 2, 28d supply, fill #0

## 2022-12-22 ENCOUNTER — Other Ambulatory Visit: Payer: Self-pay

## 2023-01-11 DIAGNOSIS — I129 Hypertensive chronic kidney disease with stage 1 through stage 4 chronic kidney disease, or unspecified chronic kidney disease: Secondary | ICD-10-CM | POA: Diagnosis not present

## 2023-01-11 DIAGNOSIS — R0683 Snoring: Secondary | ICD-10-CM | POA: Diagnosis not present

## 2023-01-11 DIAGNOSIS — N1831 Chronic kidney disease, stage 3a: Secondary | ICD-10-CM | POA: Diagnosis not present

## 2023-01-11 DIAGNOSIS — N2581 Secondary hyperparathyroidism of renal origin: Secondary | ICD-10-CM | POA: Diagnosis not present

## 2023-01-11 DIAGNOSIS — I10 Essential (primary) hypertension: Secondary | ICD-10-CM | POA: Diagnosis not present

## 2023-01-14 ENCOUNTER — Other Ambulatory Visit: Payer: Self-pay

## 2023-01-15 ENCOUNTER — Other Ambulatory Visit: Payer: Self-pay

## 2023-01-20 ENCOUNTER — Other Ambulatory Visit: Payer: Self-pay | Admitting: Family Medicine

## 2023-01-20 ENCOUNTER — Other Ambulatory Visit: Payer: Self-pay

## 2023-01-20 DIAGNOSIS — M5416 Radiculopathy, lumbar region: Secondary | ICD-10-CM

## 2023-01-20 DIAGNOSIS — M5412 Radiculopathy, cervical region: Secondary | ICD-10-CM | POA: Diagnosis not present

## 2023-01-20 DIAGNOSIS — M5082 Other cervical disc disorders, mid-cervical region, unspecified level: Secondary | ICD-10-CM | POA: Diagnosis not present

## 2023-01-20 DIAGNOSIS — M5030 Other cervical disc degeneration, unspecified cervical region: Secondary | ICD-10-CM | POA: Diagnosis not present

## 2023-01-20 DIAGNOSIS — M5136 Other intervertebral disc degeneration, lumbar region: Secondary | ICD-10-CM | POA: Diagnosis not present

## 2023-01-20 DIAGNOSIS — M62830 Muscle spasm of back: Secondary | ICD-10-CM | POA: Diagnosis not present

## 2023-01-20 MED ORDER — PREDNISONE 10 MG PO TABS
ORAL_TABLET | ORAL | 0 refills | Status: DC
  Filled 2023-01-20: qty 21, 6d supply, fill #0

## 2023-01-25 ENCOUNTER — Ambulatory Visit
Admission: RE | Admit: 2023-01-25 | Discharge: 2023-01-25 | Disposition: A | Discharge status: Home / Self Care | Payer: 59 | Source: Ambulatory Visit | Attending: Family Medicine | Admitting: Family Medicine

## 2023-01-25 DIAGNOSIS — M47816 Spondylosis without myelopathy or radiculopathy, lumbar region: Secondary | ICD-10-CM | POA: Diagnosis not present

## 2023-01-25 DIAGNOSIS — M5416 Radiculopathy, lumbar region: Secondary | ICD-10-CM | POA: Diagnosis not present

## 2023-01-25 DIAGNOSIS — M5135 Other intervertebral disc degeneration, thoracolumbar region: Secondary | ICD-10-CM | POA: Diagnosis not present

## 2023-01-25 DIAGNOSIS — M48061 Spinal stenosis, lumbar region without neurogenic claudication: Secondary | ICD-10-CM | POA: Diagnosis not present

## 2023-01-25 DIAGNOSIS — M5127 Other intervertebral disc displacement, lumbosacral region: Secondary | ICD-10-CM | POA: Diagnosis not present

## 2023-01-26 DIAGNOSIS — Z83719 Family history of colon polyps, unspecified: Secondary | ICD-10-CM | POA: Diagnosis not present

## 2023-01-26 DIAGNOSIS — K644 Residual hemorrhoidal skin tags: Secondary | ICD-10-CM | POA: Diagnosis not present

## 2023-01-26 DIAGNOSIS — Z800 Family history of malignant neoplasm of digestive organs: Secondary | ICD-10-CM | POA: Diagnosis not present

## 2023-01-26 DIAGNOSIS — Z9049 Acquired absence of other specified parts of digestive tract: Secondary | ICD-10-CM | POA: Diagnosis not present

## 2023-01-26 DIAGNOSIS — K649 Unspecified hemorrhoids: Secondary | ICD-10-CM | POA: Diagnosis not present

## 2023-01-26 DIAGNOSIS — K648 Other hemorrhoids: Secondary | ICD-10-CM | POA: Diagnosis not present

## 2023-01-26 DIAGNOSIS — Z86010 Personal history of colonic polyps: Secondary | ICD-10-CM | POA: Diagnosis not present

## 2023-01-26 DIAGNOSIS — Z6841 Body Mass Index (BMI) 40.0 and over, adult: Secondary | ICD-10-CM | POA: Diagnosis not present

## 2023-01-26 DIAGNOSIS — K5730 Diverticulosis of large intestine without perforation or abscess without bleeding: Secondary | ICD-10-CM | POA: Diagnosis not present

## 2023-01-28 DIAGNOSIS — K649 Unspecified hemorrhoids: Secondary | ICD-10-CM | POA: Diagnosis not present

## 2023-02-01 ENCOUNTER — Other Ambulatory Visit: Payer: Self-pay

## 2023-02-03 DIAGNOSIS — M5416 Radiculopathy, lumbar region: Secondary | ICD-10-CM | POA: Diagnosis not present

## 2023-02-03 DIAGNOSIS — M5412 Radiculopathy, cervical region: Secondary | ICD-10-CM | POA: Diagnosis not present

## 2023-02-03 DIAGNOSIS — M51362 Other intervertebral disc degeneration, lumbar region with discogenic back pain and lower extremity pain: Secondary | ICD-10-CM | POA: Diagnosis not present

## 2023-02-22 DIAGNOSIS — Z6841 Body Mass Index (BMI) 40.0 and over, adult: Secondary | ICD-10-CM | POA: Diagnosis not present

## 2023-02-22 DIAGNOSIS — N289 Disorder of kidney and ureter, unspecified: Secondary | ICD-10-CM | POA: Diagnosis not present

## 2023-02-22 DIAGNOSIS — G479 Sleep disorder, unspecified: Secondary | ICD-10-CM | POA: Diagnosis not present

## 2023-03-24 DIAGNOSIS — E66812 Obesity, class 2: Secondary | ICD-10-CM | POA: Diagnosis not present

## 2023-03-24 DIAGNOSIS — E039 Hypothyroidism, unspecified: Secondary | ICD-10-CM | POA: Diagnosis not present

## 2023-03-24 DIAGNOSIS — M7072 Other bursitis of hip, left hip: Secondary | ICD-10-CM | POA: Diagnosis not present

## 2023-03-24 DIAGNOSIS — I10 Essential (primary) hypertension: Secondary | ICD-10-CM | POA: Diagnosis not present

## 2023-03-24 DIAGNOSIS — R7309 Other abnormal glucose: Secondary | ICD-10-CM | POA: Diagnosis not present

## 2023-03-26 ENCOUNTER — Other Ambulatory Visit: Payer: Self-pay

## 2023-03-26 DIAGNOSIS — Z0000 Encounter for general adult medical examination without abnormal findings: Secondary | ICD-10-CM | POA: Diagnosis not present

## 2023-03-26 DIAGNOSIS — I10 Essential (primary) hypertension: Secondary | ICD-10-CM | POA: Diagnosis not present

## 2023-03-26 DIAGNOSIS — N183 Chronic kidney disease, stage 3 unspecified: Secondary | ICD-10-CM | POA: Diagnosis not present

## 2023-03-26 DIAGNOSIS — E039 Hypothyroidism, unspecified: Secondary | ICD-10-CM | POA: Diagnosis not present

## 2023-03-26 DIAGNOSIS — E119 Type 2 diabetes mellitus without complications: Secondary | ICD-10-CM | POA: Diagnosis not present

## 2023-03-26 MED ORDER — OZEMPIC (0.25 OR 0.5 MG/DOSE) 2 MG/3ML ~~LOC~~ SOPN
PEN_INJECTOR | SUBCUTANEOUS | 1 refills | Status: DC
Start: 2023-03-26 — End: 2023-05-26
  Filled 2023-03-26 – 2023-03-30 (×2): qty 3, 28d supply, fill #0
  Filled 2023-04-29: qty 3, 28d supply, fill #1

## 2023-03-26 MED ORDER — DAPAGLIFLOZIN PROPANEDIOL 5 MG PO TABS
5.0000 mg | ORAL_TABLET | Freq: Every day | ORAL | 1 refills | Status: AC
  Filled 2023-03-26: qty 30, 30d supply, fill #0

## 2023-03-26 MED ORDER — LEVOTHYROXINE SODIUM 175 MCG PO TABS
175.0000 ug | ORAL_TABLET | Freq: Every day | ORAL | 1 refills | Status: AC
  Filled 2023-03-26: qty 90, 90d supply, fill #0

## 2023-03-30 ENCOUNTER — Other Ambulatory Visit: Payer: Self-pay

## 2023-04-02 ENCOUNTER — Other Ambulatory Visit: Payer: Self-pay

## 2023-04-13 DIAGNOSIS — E119 Type 2 diabetes mellitus without complications: Secondary | ICD-10-CM | POA: Diagnosis not present

## 2023-04-13 DIAGNOSIS — E0500 Thyrotoxicosis with diffuse goiter without thyrotoxic crisis or storm: Secondary | ICD-10-CM | POA: Diagnosis not present

## 2023-04-13 DIAGNOSIS — H5213 Myopia, bilateral: Secondary | ICD-10-CM | POA: Diagnosis not present

## 2023-04-13 DIAGNOSIS — H524 Presbyopia: Secondary | ICD-10-CM | POA: Diagnosis not present

## 2023-04-29 ENCOUNTER — Other Ambulatory Visit: Payer: Self-pay

## 2023-05-04 ENCOUNTER — Other Ambulatory Visit: Payer: Self-pay

## 2023-05-26 ENCOUNTER — Other Ambulatory Visit: Payer: Self-pay

## 2023-05-26 MED ORDER — OZEMPIC (1 MG/DOSE) 4 MG/3ML ~~LOC~~ SOPN
1.0000 mg | PEN_INJECTOR | SUBCUTANEOUS | 1 refills | Status: DC
  Filled 2023-05-26 – 2023-06-03 (×5): qty 3, 28d supply, fill #0

## 2023-05-26 MED ORDER — AMLODIPINE BESYLATE 10 MG PO TABS
10.0000 mg | ORAL_TABLET | Freq: Every day | ORAL | 0 refills | Status: DC
  Filled 2023-05-26: qty 90, 90d supply, fill #0

## 2023-05-27 ENCOUNTER — Other Ambulatory Visit: Payer: Self-pay

## 2023-05-31 ENCOUNTER — Other Ambulatory Visit: Payer: Self-pay

## 2023-06-01 ENCOUNTER — Other Ambulatory Visit: Payer: Self-pay | Admitting: Internal Medicine

## 2023-06-01 DIAGNOSIS — Z1231 Encounter for screening mammogram for malignant neoplasm of breast: Secondary | ICD-10-CM

## 2023-06-01 DIAGNOSIS — G4733 Obstructive sleep apnea (adult) (pediatric): Secondary | ICD-10-CM | POA: Diagnosis not present

## 2023-06-03 ENCOUNTER — Other Ambulatory Visit: Payer: Self-pay

## 2023-06-03 ENCOUNTER — Ambulatory Visit
Admission: RE | Admit: 2023-06-03 | Discharge: 2023-06-03 | Disposition: A | Discharge status: Home / Self Care | Payer: 59 | Source: Ambulatory Visit | Attending: Internal Medicine | Admitting: Internal Medicine

## 2023-06-03 DIAGNOSIS — Z1231 Encounter for screening mammogram for malignant neoplasm of breast: Secondary | ICD-10-CM | POA: Diagnosis not present

## 2023-06-14 DIAGNOSIS — G4733 Obstructive sleep apnea (adult) (pediatric): Secondary | ICD-10-CM | POA: Diagnosis not present

## 2023-06-25 ENCOUNTER — Other Ambulatory Visit: Payer: Self-pay

## 2023-06-25 MED ORDER — AMOXICILLIN 500 MG PO CAPS
ORAL_CAPSULE | ORAL | 0 refills | Status: DC
  Filled 2023-06-25: qty 4, 1d supply, fill #0

## 2023-06-28 ENCOUNTER — Other Ambulatory Visit: Payer: Self-pay

## 2023-06-28 DIAGNOSIS — N1831 Chronic kidney disease, stage 3a: Secondary | ICD-10-CM | POA: Diagnosis not present

## 2023-06-28 DIAGNOSIS — M65932 Unspecified synovitis and tenosynovitis, left forearm: Secondary | ICD-10-CM | POA: Diagnosis not present

## 2023-06-28 DIAGNOSIS — E1122 Type 2 diabetes mellitus with diabetic chronic kidney disease: Secondary | ICD-10-CM | POA: Diagnosis not present

## 2023-06-28 DIAGNOSIS — N183 Chronic kidney disease, stage 3 unspecified: Secondary | ICD-10-CM | POA: Diagnosis not present

## 2023-06-28 DIAGNOSIS — I10 Essential (primary) hypertension: Secondary | ICD-10-CM | POA: Diagnosis not present

## 2023-06-28 DIAGNOSIS — N2581 Secondary hyperparathyroidism of renal origin: Secondary | ICD-10-CM | POA: Diagnosis not present

## 2023-06-28 DIAGNOSIS — E039 Hypothyroidism, unspecified: Secondary | ICD-10-CM | POA: Diagnosis not present

## 2023-06-28 MED ORDER — OZEMPIC (2 MG/DOSE) 8 MG/3ML ~~LOC~~ SOPN
2.0000 mg | PEN_INJECTOR | SUBCUTANEOUS | 2 refills | Status: DC
  Filled 2023-06-28 – 2023-07-02 (×3): qty 3, 28d supply, fill #0
  Filled 2023-07-23: qty 3, 28d supply, fill #1
  Filled 2023-08-20: qty 3, 28d supply, fill #2

## 2023-06-28 MED ORDER — COLCHICINE 0.6 MG PO TABS
ORAL_TABLET | ORAL | 1 refills | Status: AC
  Filled 2023-06-28: qty 37, 30d supply, fill #0
  Filled 2024-01-12: qty 23, 16d supply, fill #1

## 2023-06-28 MED ORDER — DAPAGLIFLOZIN PROPANEDIOL 5 MG PO TABS
5.0000 mg | ORAL_TABLET | Freq: Every day | ORAL | 1 refills | Status: AC
Start: 2023-06-28 — End: ?
  Filled 2023-06-28: qty 90, 90d supply, fill #0
  Filled 2023-10-06 (×2): qty 90, 90d supply, fill #1

## 2023-06-28 MED ORDER — AMLODIPINE BESYLATE 10 MG PO TABS
10.0000 mg | ORAL_TABLET | Freq: Every day | ORAL | 1 refills | Status: AC
  Filled 2023-06-28 – 2023-10-06 (×2): qty 90, 90d supply, fill #0
  Filled 2024-03-19: qty 90, 90d supply, fill #1

## 2023-06-28 MED ORDER — FLUCONAZOLE 150 MG PO TABS
150.0000 mg | ORAL_TABLET | Freq: Once | ORAL | 2 refills | Status: AC | PRN
  Filled 2023-06-28: qty 1, 1d supply, fill #0

## 2023-06-28 MED ORDER — CITALOPRAM HYDROBROMIDE 40 MG PO TABS
40.0000 mg | ORAL_TABLET | Freq: Every day | ORAL | 1 refills | Status: AC
Start: 2023-06-28 — End: ?
  Filled 2023-06-28: qty 90, 90d supply, fill #0
  Filled 2023-10-06: qty 90, 90d supply, fill #1

## 2023-06-28 MED ORDER — BUPROPION HCL ER (XL) 300 MG PO TB24
300.0000 mg | ORAL_TABLET | Freq: Every day | ORAL | 1 refills | Status: AC
  Filled 2023-06-28: qty 90, 90d supply, fill #0
  Filled 2023-10-06: qty 90, 90d supply, fill #1

## 2023-06-28 MED ORDER — LEVOTHYROXINE SODIUM 175 MCG PO TABS
175.0000 ug | ORAL_TABLET | Freq: Every day | ORAL | 1 refills | Status: AC
  Filled 2023-06-28: qty 90, 90d supply, fill #0
  Filled 2023-10-06: qty 90, 90d supply, fill #1

## 2023-06-29 ENCOUNTER — Other Ambulatory Visit: Payer: Self-pay

## 2023-06-29 MED ORDER — PREDNISONE 10 MG PO TABS
ORAL_TABLET | ORAL | 0 refills | Status: DC
  Filled 2023-06-29: qty 21, 6d supply, fill #0

## 2023-07-01 ENCOUNTER — Other Ambulatory Visit: Payer: Self-pay

## 2023-07-02 ENCOUNTER — Other Ambulatory Visit: Payer: Self-pay

## 2023-07-12 DIAGNOSIS — G4733 Obstructive sleep apnea (adult) (pediatric): Secondary | ICD-10-CM | POA: Diagnosis not present

## 2023-07-15 ENCOUNTER — Other Ambulatory Visit: Payer: Self-pay

## 2023-07-15 DIAGNOSIS — H02531 Eyelid retraction right upper eyelid: Secondary | ICD-10-CM | POA: Diagnosis not present

## 2023-07-15 DIAGNOSIS — E0500 Thyrotoxicosis with diffuse goiter without thyrotoxic crisis or storm: Secondary | ICD-10-CM | POA: Diagnosis not present

## 2023-07-25 ENCOUNTER — Other Ambulatory Visit: Payer: Self-pay

## 2023-07-26 ENCOUNTER — Other Ambulatory Visit: Payer: Self-pay

## 2023-07-29 ENCOUNTER — Other Ambulatory Visit: Payer: Self-pay

## 2023-08-12 DIAGNOSIS — G4733 Obstructive sleep apnea (adult) (pediatric): Secondary | ICD-10-CM | POA: Diagnosis not present

## 2023-08-20 ENCOUNTER — Other Ambulatory Visit: Payer: Self-pay

## 2023-08-25 ENCOUNTER — Other Ambulatory Visit: Payer: Self-pay

## 2023-08-25 MED ORDER — AMOXICILLIN 500 MG PO CAPS
500.0000 mg | ORAL_CAPSULE | Freq: Three times a day (TID) | ORAL | 0 refills | Status: AC
  Filled 2023-08-25: qty 21, 7d supply, fill #0

## 2023-08-26 ENCOUNTER — Other Ambulatory Visit: Payer: Self-pay

## 2023-08-26 DIAGNOSIS — Z8739 Personal history of other diseases of the musculoskeletal system and connective tissue: Secondary | ICD-10-CM | POA: Diagnosis not present

## 2023-08-26 DIAGNOSIS — M109 Gout, unspecified: Secondary | ICD-10-CM | POA: Diagnosis not present

## 2023-08-26 MED ORDER — PREDNISONE 10 MG PO TABS
ORAL_TABLET | ORAL | 0 refills | Status: DC
Start: 2023-08-26 — End: 2023-10-05
  Filled 2023-08-26: qty 21, 6d supply, fill #0

## 2023-08-26 MED ORDER — HYDROCODONE-ACETAMINOPHEN 5-325 MG PO TABS
1.0000 | ORAL_TABLET | Freq: Three times a day (TID) | ORAL | 0 refills | Status: AC | PRN
  Filled 2023-08-26: qty 15, 5d supply, fill #0

## 2023-08-26 NOTE — Progress Notes (Signed)
 Chief Complaint:   Chief Complaint  Patient presents with  . Foot Pain    X 2 days  Per patient left foot pain and swelling  Patient states she has been taking colcrys   Per patient has been using ice pack  Patient states she has a history of gout  Denies injury    Subjective:   Sara Hebert is a 63 y.o. female established patient in today for:  HPI Patient presents to clinic with complaint of left great toe pain, redness, and swelling. Onset yesterday. No direct falls or injuries. Does report a h/o gout. This will be patient's third flare up this year. She did start her colchicine , but has not had relief prompting her visit. States previously on one occasion she responded to colchicine  alone, on the other occasion she had to have prednisone  as well. She does report eating fried chicken Monday, which she has not done since she got diagnosed with gout. Rarely drinks alcohol and has not recently. She denies fever, chills, myalgias, dyspnea, chest pain, nausea, vomiting, numbness, tingling. No falls or injury. Works in the ED - walking all the time.     Past Medical History:  Diagnosis Date  . Allergy    pt states she is unsure why on chart  . Angioedema   . Anxiety 2010  . Arthritis 2015  . Chronic kidney disease   . Depression   . Hypertension   . Kidney cysts   . Thyroid  disease     Past Surgical History:  Procedure Laterality Date  . HYSTERECTOMY  1994  . CHOLECYSTECTOMY  2018  . REPLACEMENT TOTAL KNEE Right 08/03/2019   Dr. Kathlynn  . COLONOSCOPY  04/07/2021   Diverticulosis/Int. Hem./Otherwise normal colon/FHx CP/Repeat 2yrs/SMR  . JOINT REPLACEMENT  08/03/2019 08/01/2020    Outpatient Medications Prior to Visit  Medication Sig Dispense Refill  . acetaminophen  (TYLENOL ) 500 mg capsule Take 1,000 mg by mouth every 6 (six) hours as needed for Pain    . acetaminophen /diphenhydramine  (TYLENOL  PM ORAL) Take by mouth nightly    . amLODIPine  (NORVASC ) 10 MG tablet Take 1  tablet (10 mg total) by mouth once daily 90 tablet 1  . amoxicillin  (AMOXIL ) 500 MG capsule Take 4 capsules (2,000 mg total) by mouth once for 1 dose 1 hour prior to dental procedure    . buPROPion  (WELLBUTRIN  XL) 300 MG XL tablet Take 1 tablet (300 mg total) by mouth once daily 90 tablet 1  . citalopram  (CELEXA ) 40 MG tablet Take 1 tablet (40 mg total) by mouth once daily 90 tablet 1  . colchicine  (COLCRYS ) 0.6 mg tablet Take 1 tablet (0.6 mg total) by mouth 2 (two) times daily Take 1 tablet twice a day for 1 week and then 1 tablet once a day 30 tablet 1  . cyclobenzaprine  (FLEXERIL ) 10 MG tablet Take 1 tablet (10 mg total) by mouth 2 (two) times daily as needed for Muscle spasms 30 tablet 1  . dapagliflozin  propanediol (FARXIGA ) 5 mg tablet Take 1 tablet (5 mg total) by mouth once daily 90 tablet 1  . diazePAM  (VALIUM ) 2 MG tablet Take 1 tablet (2 mg total) by mouth once daily as needed for Anxiety for up to 2 doses 2 tablet 0  . docusate (COLACE) 100 MG capsule Take 1 capsule by mouth 2 (two) times daily    . levothyroxine  (SYNTHROID ) 175 MCG tablet Take 1 tablet (175 mcg total) by mouth once daily 90 tablet 1  . semaglutide  (OZEMPIC )  2 mg/dose (8 mg/3 mL) pen injector Inject 0.75 mLs (2 mg total) subcutaneously once a week 3 mL 2  . predniSONE  (DELTASONE ) 10 MG tablet 6 day taper - Take as directed 21 tablet 0   No facility-administered medications prior to visit.    Allergies  Allergen Reactions  . Lisinopril  Swelling    angioedema  . Pineapple Itching    Family History  Problem Relation Name Age of Onset  . Kidney disease Mother Angela        dialysis 19 yrs  . High blood pressure (Hypertension) Mother Angela   . Breast cancer Mother Angela   . High blood pressure (Hypertension) Father Jori   . Other Father Jori        enlarged heart  . Diabetes type II Father Jori   . High blood pressure (Hypertension) Sister Yancy   . Colon polyps Sister Yancy   . Colon polyps Sister  Rojelio   . Colon polyps Brother Franky     Social History   Tobacco Use  . Smoking status: Never  . Smokeless tobacco: Never  Vaping Use  . Vaping status: Never Used  Substance Use Topics  . Alcohol use: Not Currently    Comment: very seldom  . Drug use: Not Currently      Review of Systems  Pertinent positive and negative ROS as documented in HPI.    Objective:   Blood pressure 122/74, pulse 83, temperature 37.2 C (99 F), temperature source Oral, height 162.6 cm (5' 4), weight 95 kg (209 lb 6.4 oz), SpO2 98%.  Physical Exam Vitals and nursing note reviewed.  Constitutional:      General: She is not in acute distress.    Appearance: Normal appearance. She is not ill-appearing, toxic-appearing or diaphoretic.  HENT:     Head: Normocephalic and atraumatic.  Eyes:     General: No scleral icterus.       Right eye: No discharge.        Left eye: No discharge.     Conjunctiva/sclera: Conjunctivae normal.  Cardiovascular:     Rate and Rhythm: Normal rate and regular rhythm.     Heart sounds: Normal heart sounds.  Pulmonary:     Effort: Pulmonary effort is normal. No respiratory distress.     Breath sounds: Normal breath sounds. No wheezing or rales.     Comments: Respiratory exam as documented. Musculoskeletal:     Cervical back: Normal range of motion.     Comments: Left foot: warmth, redness, swelling, and tenderness at 1st MTP joint. No other tenderness noted to foot/ankle. Able to move toes. Able to ROM at ankle. 2+ DP pulse. Capillary refill intact. Sensation to light touch grossly intact.  Skin:    General: Skin is warm and dry.  Neurological:     Mental Status: She is alert.  Psychiatric:        Mood and Affect: Mood normal.        Behavior: Behavior normal.     No results found for this visit on 08/26/23.   Assessment/Plan:   1. Acute gout of left foot, unspecified cause -     predniSONE  (DELTASONE ) 10 MG tablet; 6-day taper; 6 pills day one, 5  pills day 2, 4 pills day 3, etc. Follow package instructions.  Dispense: 21 tablet; Refill: 0 -     HYDROcodone -acetaminophen  (NORCO) 5-325 mg tablet; Take one tablet at night for pain; may take up to every 8 hours as needed for  pain if not working or driving  Dispense: 15 tablet; Refill: 0  2. H/O: gout   Patient is pleasant, cooperative, in no apparent distress. Vitals are okay. Here with left great toe redness, swelling, warmth, and pain. No injury. Neurovascularly intact. Suspect gout. Will rx prednisone  taper. Norco for severe pain. Use with caution, can cause drowsiness. Further symptomatic treatment discussed. Patient questions addressed. Follow up with PCP if no improvement. Stable for discharge.    Attestation Statement:   I personally performed the service, non-incident to. (WP)   ASHLEY BRIDGETTE POISSON, PA      Patient Instructions  Please take prednisone  taper as prescribed.  Cool compresses and elevate.  Norco for pain relief. Take medications as directed. Use norco with caution; may cause drowsiness. Do not drive or work while taking. Take mostly at night; may use during the day if not working.  Recommend following up with PCP with regard to recurrent flare ups of gout.  Return if new/worsening symptoms - increased redness, swelling, pain, or fever.

## 2023-09-11 DIAGNOSIS — G4733 Obstructive sleep apnea (adult) (pediatric): Secondary | ICD-10-CM | POA: Diagnosis not present

## 2023-09-17 ENCOUNTER — Other Ambulatory Visit: Payer: Self-pay

## 2023-09-20 ENCOUNTER — Other Ambulatory Visit: Payer: Self-pay

## 2023-09-20 MED ORDER — OZEMPIC (2 MG/DOSE) 8 MG/3ML ~~LOC~~ SOPN
2.0000 mg | PEN_INJECTOR | SUBCUTANEOUS | 2 refills | Status: DC
  Filled 2023-09-20: qty 3, 28d supply, fill #0
  Filled 2023-10-20: qty 3, 28d supply, fill #1
  Filled 2023-11-17: qty 3, 28d supply, fill #2

## 2023-09-30 ENCOUNTER — Other Ambulatory Visit: Payer: Self-pay

## 2023-10-05 DIAGNOSIS — N1831 Chronic kidney disease, stage 3a: Secondary | ICD-10-CM | POA: Diagnosis not present

## 2023-10-05 DIAGNOSIS — E1122 Type 2 diabetes mellitus with diabetic chronic kidney disease: Secondary | ICD-10-CM | POA: Diagnosis not present

## 2023-10-05 DIAGNOSIS — E039 Hypothyroidism, unspecified: Secondary | ICD-10-CM | POA: Diagnosis not present

## 2023-10-05 DIAGNOSIS — N183 Chronic kidney disease, stage 3 unspecified: Secondary | ICD-10-CM | POA: Diagnosis not present

## 2023-10-05 DIAGNOSIS — I10 Essential (primary) hypertension: Secondary | ICD-10-CM | POA: Diagnosis not present

## 2023-10-05 NOTE — Progress Notes (Signed)
 Chief Complaint:   Chief Complaint  Patient presents with  . Follow-up    Patient presenting follow-up of diabetes, hypertension and thumb tendinitis    Subjective:   Sara Hebert is a 63 y.o. female in today for follow up today:  History of Present Illness Sara Hebert is a 63 year old female who presents for follow-up on weight management and medication review.  She is currently on Ozempic  at a 2 mg dose, experiencing minimal nausea and acid reflux, and has successfully lost weight, now weighing under 200 pounds. Her blood pressure remains stable, but she acknowledges the need for close monitoring due to kidney issues. She takes several medications daily, including thyroid  medication, Farxiga , blood pressure medication, Celexa , ibuprofen, and Wellbutrin .  Approximately one and a half to two months ago, she experienced an episode of gout, which has since resolved.  He had to be put on prednisone  at the time from the walk-in clinic. She has not experienced any kidney stones or related pain recently.  She reports occasional episodes of elevated heart rate, with a noted instance of her pulse reaching 106 while donating blood, although her blood pressure was normal at that time. She has not experienced heart rates exceeding 120.  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   Current Outpatient Medications  Medication Sig Dispense Refill  . acetaminophen  (TYLENOL ) 500 mg capsule Take 1,000 mg by mouth every 6 (six) hours as needed for Pain    . acetaminophen /diphenhydramine  (TYLENOL  PM ORAL) Take by mouth nightly    . amLODIPine  (NORVASC ) 10 MG tablet Take 1 tablet (10 mg total) by mouth once daily 90 tablet 1  . amoxicillin  (AMOXIL ) 500 MG capsule Take 4 capsules (2,000 mg total) by mouth once for 1 dose 1 hour prior to dental procedure    . buPROPion  (WELLBUTRIN  XL) 300 MG XL tablet Take 1 tablet (300 mg total) by mouth once  daily 90 tablet 1  . citalopram  (CELEXA ) 40 MG tablet Take 1 tablet (40 mg total) by mouth once daily 90 tablet 1  . colchicine  (COLCRYS ) 0.6 mg tablet Take 1 tablet (0.6 mg total) by mouth 2 (two) times daily Take 1 tablet twice a day for 1 week and then 1 tablet once a day 30 tablet 1  . cyclobenzaprine  (FLEXERIL ) 10 MG tablet Take 1 tablet (10 mg total) by mouth 2 (two) times daily as needed for Muscle spasms 30 tablet 1  . dapagliflozin  propanediol (FARXIGA ) 5 mg tablet Take 1 tablet (5 mg total) by mouth once daily 90 tablet 1  . diazePAM  (VALIUM ) 2 MG tablet Take 1 tablet (2 mg total) by mouth once daily as needed for Anxiety for up to 2 doses 2 tablet 0  . docusate (COLACE) 100 MG capsule Take 1 capsule by mouth 2 (two) times daily    . HYDROcodone -acetaminophen  (NORCO) 5-325 mg tablet Take one tablet at night for pain; may take up to every 8 hours as needed for pain if not working or driving 15 tablet 0  . levothyroxine  (SYNTHROID ) 175 MCG tablet Take 1 tablet (175 mcg total) by mouth once daily 90 tablet 1  . OZEMPIC  2 mg/dose (8 mg/3 mL) pen injector Inject 2 mg into the skin once a week. 3 mL 2   No current facility-administered medications for this visit.    Allergies as of 10/05/2023 - Reviewed 10/05/2023  Allergen Reaction Noted  . Lisinopril  Swelling 08/20/2022  .  Pineapple Itching 03/26/2023    Past Medical History:  Diagnosis Date  . Allergy    pt states she is unsure why on chart  . Angioedema   . Anxiety 2010  . Arthritis 2015  . Chronic kidney disease   . Depression   . Hypertension   . Kidney cysts   . Thyroid  disease     Past Surgical History:  Procedure Laterality Date  . HYSTERECTOMY  1994  . CHOLECYSTECTOMY  2018  . REPLACEMENT TOTAL KNEE Right 08/03/2019   Dr. Kathlynn  . COLONOSCOPY  04/07/2021   Diverticulosis/Int. Hem./Otherwise normal colon/FHx CP/Repeat 72yrs/SMR  . JOINT REPLACEMENT  08/03/2019 08/01/2020     Family History  Problem Relation  Name Age of Onset  . Kidney disease Mother Angela        dialysis 19 yrs  . High blood pressure (Hypertension) Mother Angela   . Breast cancer Mother Angela   . High blood pressure (Hypertension) Father Jori   . Other Father Jori        enlarged heart  . Diabetes type II Father Jori   . High blood pressure (Hypertension) Sister Yancy   . Colon polyps Sister Yancy   . Colon polyps Sister Rojelio   . Colon polyps Brother Franky     Social History:  reports that she has never smoked. She has never used smokeless tobacco. She reports that she does not currently use alcohol. She reports that she does not currently use drugs.  Results for orders placed or performed in visit on 10/05/23  CBC w/auto Differential (5 Part)  Result Value Ref Range   WBC (White Blood Cell Count) 5.0 4.1 - 10.2 10^3/uL   RBC (Red Blood Cell Count) 4.50 4.04 - 5.48 10^6/uL   Hemoglobin 13.8 12.0 - 15.0 gm/dL   Hematocrit 58.1 64.9 - 47.0 %   MCV (Mean Corpuscular Volume) 92.9 80.0 - 100.0 fl   MCH (Mean Corpuscular Hemoglobin) 30.7 27.0 - 31.2 pg   MCHC (Mean Corpuscular Hemoglobin Concentration) 33.0 32.0 - 36.0 gm/dL   Platelet Count 708 849 - 450 10^3/uL   RDW-CV (Red Cell Distribution Width) 13.1 11.6 - 14.8 %   MPV (Mean Platelet Volume) 9.4 9.4 - 12.4 fl   Neutrophils 2.83 1.50 - 7.80 10^3/uL   Lymphocytes 1.51 1.00 - 3.60 10^3/uL   Monocytes 0.40 0.00 - 1.50 10^3/uL   Eosinophils 0.16 0.00 - 0.55 10^3/uL   Basophils 0.04 0.00 - 0.09 10^3/uL   Neutrophil % 57.1 32.0 - 70.0 %   Lymphocyte % 30.4 10.0 - 50.0 %   Monocyte % 8.1 4.0 - 13.0 %   Eosinophil % 3.2 1.0 - 5.0 %   Basophil% 0.8 0.0 - 2.0 %   Immature Granulocyte % 0.4 <=0.7 %   Immature Granulocyte Count 0.02 <=0.06 10^3/L  Comprehensive Metabolic Panel (CMP)  Result Value Ref Range   Glucose 91 70 - 110 mg/dL   Sodium 854 863 - 854 mmol/L   Potassium 4.1 3.6 - 5.1 mmol/L   Chloride 107 97 - 109 mmol/L   Carbon Dioxide (CO2) 31.1 22.0  - 32.0 mmol/L   Urea Nitrogen (BUN) 16 7 - 25 mg/dL   Creatinine 1.2 (H) 0.6 - 1.1 mg/dL   Glomerular Filtration Rate (eGFR) 52 (L) >60 mL/min/1.73sq m   Calcium 9.4 8.7 - 10.3 mg/dL   AST  16 8 - 39 U/L   ALT  12 5 - 38 U/L   Alk Phos (alkaline Phosphatase) 104  34 - 104 U/L   Albumin 4.3 3.5 - 4.8 g/dL   Bilirubin, Total 0.9 0.3 - 1.2 mg/dL   Protein, Total 6.2 6.1 - 7.9 g/dL   A/G Ratio 2.3 1.0 - 5.0 gm/dL  Hemoglobin J8R  Result Value Ref Range   Hemoglobin A1C 5.2 4.2 - 5.6 %   Average Blood Glucose (Calc) 103 mg/dL   Narrative   Normal Range:    4.2 - 5.6% Increased Risk:  5.7 - 6.4% Diabetes:        >= 6.5% Glycemic Control for adults with diabetes:  <7%    Thyroid  Stimulating-Hormone (TSH)  Result Value Ref Range   Thyroid  Stimulating Hormone (TSH) 0.599 0.450-5.330 uIU/ml uIU/mL  Thyroxine (T4), Free  Result Value Ref Range   Thyroxine, Free (FT4) 0.87 0.66 - 1.14 ng/dL   Narrative   This reference range applies to non-pregnant individuals 18 or older. Contact Laboratory for reference ranges for pregnant patients.  Biotin ingestion may interfere with free T4 and Total T3 tests. If the results are inconsistent with the TSH level, previous test results, or the clinical presentation, then consider biotin interference. If needed, order repeat testing after stopping biotin.      ROS:  General: No fever, chills or recent illness.  Weight loss noted Skin:   No skin lesions, growths, masses, rashes, pruritus  HEENT: No change in vision or hearing. No pain or difficulty with swallowing Respiratory: No cough or shortness of breath CV:  No chest pain, occasional palpitations GI:  No pain, dyspepsia or change in bowel habits GU:  No dysuria, frequency, or hesitancy MSK:  No joint pain or injury Neurological: No headaches, changes in mental status, loss of sensation or strength  Psychological: No anxiety insomnia or depression  Objective:   Body mass index is 32.95  kg/m.  BP 112/68   Pulse 91   Ht 165.1 cm (5' 5)   Wt 89.8 kg (198 lb)   LMP  (LMP Unknown)   SpO2 98%   BMI 32.95 kg/m   General: WD/WN female, in no acute distress HEENT: Pupils equal and round, EOMI. oral mucosa moist.  Oropharynx clear. Neck: supple, trachea midline; no thyromegaly Respiratory:clear to auscultation.  No dullness to percussion.  No use of accessory muscles. Cardiac:  Regular rate and rhythm without murmur, gallops, or rubs Abdominal:soft, nontender, positive bowel sounds.  No organomegaly. Musculoskeletal:  No clubbing, cyanosis or edema.  Full range of motion in upper and lower extremities bilaterally. Neuro: CN grossly intact.  No acute decrease in sensation in the upper and lower extremities bilaterally.   Assessment/Plan:   Essential hypertension  (primary encounter diagnosis) Acquired hypothyroidism Stage 3 chronic kidney disease, unspecified whether stage 3a or 3b CKD (CMS/HHS-HCC) Type 2 diabetes mellitus with stage 3a chronic kidney disease, without long-term current use of insulin  (CMS/HHS-HCC)  Assessment & Plan   Type 2 diabetes mellitus with stage 3a chronic kidney disease - Diabetes is well-managed with Ozempic  (semaglutide ) 2 mg subcutaneous once a week.  - Reports minimal nausea , which is  manageable. Blood sugar levels are stable - Continue low-carb diet.  Last A1c was at 6.8, labs to be done today.  2.  CKD stage IIIa- - Baseline GFR seems to be around 50. - Follows with nephrology - Avoid NSAIDs and other nephrotoxins - Trying to get diabetes and blood pressure under better control.  Monitor closely.  3. Essential hypertension - Blood pressure is well-controlled, likely aided by recent weight loss.  -  On any oral medications to help at this time as blood pressures are on the low normal side - Low-salt diet and monitor  4. Tachycardia - Reports occasional elevated heart rate, with a pulse reaching 106 bpm during a blood donation.   - Blood pressure was stable at the time.  -Continue to monitor heart rate, thyroid  labs to be checked.  Stay hydrated - Consider 48-hour heart rate monitoring if tachycardia persists  5. Acquired hypothyroidism - Thyroid  function was stable as of the last check   -. Adherent to levothyroxine  regimen. - Continue levothyroxine  175 mcg oral once daily     Follow-up with me in 5 months for annual physical.  Sooner for acute issues.  Labs prior.  Might need EKG and physical     Goals     .  Follow my doctor's care plan    . * Lose Weight (pt-stated)    . * Patient Goals (pt-stated)      Patient would like to get her knee pain controlled.        LAVENIA BEAVER, MD  Portions of this note were created using dictation software and may contain typographical errors.  *Some images could not be shown.

## 2023-10-06 ENCOUNTER — Other Ambulatory Visit: Payer: Self-pay

## 2023-10-08 DIAGNOSIS — G4733 Obstructive sleep apnea (adult) (pediatric): Secondary | ICD-10-CM | POA: Diagnosis not present

## 2023-10-12 DIAGNOSIS — G4733 Obstructive sleep apnea (adult) (pediatric): Secondary | ICD-10-CM | POA: Diagnosis not present

## 2023-11-11 DIAGNOSIS — G4733 Obstructive sleep apnea (adult) (pediatric): Secondary | ICD-10-CM | POA: Diagnosis not present

## 2023-12-12 DIAGNOSIS — G4733 Obstructive sleep apnea (adult) (pediatric): Secondary | ICD-10-CM | POA: Diagnosis not present

## 2023-12-25 ENCOUNTER — Other Ambulatory Visit: Payer: Self-pay

## 2023-12-27 ENCOUNTER — Other Ambulatory Visit: Payer: Self-pay

## 2023-12-27 MED ORDER — OZEMPIC (2 MG/DOSE) 8 MG/3ML ~~LOC~~ SOPN
2.0000 mg | PEN_INJECTOR | SUBCUTANEOUS | 2 refills | Status: DC
  Filled 2023-12-27: qty 3, 28d supply, fill #0
  Filled 2024-02-15 (×2): qty 3, 28d supply, fill #1
  Filled 2024-03-19 – 2024-04-10 (×2): qty 3, 28d supply, fill #2

## 2024-01-12 ENCOUNTER — Other Ambulatory Visit: Payer: Self-pay

## 2024-01-12 DIAGNOSIS — G4733 Obstructive sleep apnea (adult) (pediatric): Secondary | ICD-10-CM | POA: Diagnosis not present

## 2024-02-15 ENCOUNTER — Other Ambulatory Visit: Payer: Self-pay

## 2024-02-17 ENCOUNTER — Other Ambulatory Visit: Payer: Self-pay

## 2024-03-19 ENCOUNTER — Other Ambulatory Visit: Payer: Self-pay

## 2024-03-20 ENCOUNTER — Other Ambulatory Visit: Payer: Self-pay

## 2024-03-20 MED ORDER — LEVOTHYROXINE SODIUM 175 MCG PO TABS
175.0000 ug | ORAL_TABLET | Freq: Every day | ORAL | 1 refills | Status: AC
  Filled 2024-03-20: qty 90, 90d supply, fill #0

## 2024-03-20 MED ORDER — BUPROPION HCL ER (XL) 300 MG PO TB24
300.0000 mg | ORAL_TABLET | Freq: Every day | ORAL | 1 refills | Status: AC
  Filled 2024-03-20: qty 90, 90d supply, fill #0

## 2024-03-20 MED ORDER — CITALOPRAM HYDROBROMIDE 40 MG PO TABS
40.0000 mg | ORAL_TABLET | Freq: Every day | ORAL | 1 refills | Status: AC
  Filled 2024-03-20: qty 90, 90d supply, fill #0

## 2024-03-20 MED ORDER — DAPAGLIFLOZIN PROPANEDIOL 5 MG PO TABS
5.0000 mg | ORAL_TABLET | Freq: Every day | ORAL | 1 refills | Status: AC
  Filled 2024-03-20: qty 90, 90d supply, fill #0

## 2024-03-27 ENCOUNTER — Other Ambulatory Visit: Payer: Self-pay

## 2024-04-03 ENCOUNTER — Other Ambulatory Visit: Payer: Self-pay

## 2024-04-10 ENCOUNTER — Other Ambulatory Visit: Payer: Self-pay

## 2024-04-18 DIAGNOSIS — E0500 Thyrotoxicosis with diffuse goiter without thyrotoxic crisis or storm: Secondary | ICD-10-CM | POA: Diagnosis not present

## 2024-04-18 DIAGNOSIS — E119 Type 2 diabetes mellitus without complications: Secondary | ICD-10-CM | POA: Diagnosis not present

## 2024-04-18 DIAGNOSIS — H0520 Unspecified exophthalmos: Secondary | ICD-10-CM | POA: Diagnosis not present

## 2024-04-18 DIAGNOSIS — H02531 Eyelid retraction right upper eyelid: Secondary | ICD-10-CM | POA: Diagnosis not present

## 2024-05-22 ENCOUNTER — Other Ambulatory Visit: Payer: Self-pay

## 2024-05-22 MED ORDER — OZEMPIC (2 MG/DOSE) 8 MG/3ML ~~LOC~~ SOPN
2.0000 mg | PEN_INJECTOR | SUBCUTANEOUS | 2 refills | Status: AC
  Filled 2024-05-22: qty 9, 84d supply, fill #0

## 2024-05-22 MED ORDER — HYDROXYZINE HCL 10 MG PO TABS
10.0000 mg | ORAL_TABLET | Freq: Every evening | ORAL | 0 refills | Status: AC | PRN
  Filled 2024-05-22: qty 30, 30d supply, fill #0

## 2024-05-22 MED ORDER — NALOXONE HCL 4 MG/0.1ML NA LIQD
4.0000 mg | Freq: Once | NASAL | 1 refills | Status: AC | PRN
  Filled 2024-05-22: qty 2, 30d supply, fill #0

## 2024-05-24 ENCOUNTER — Other Ambulatory Visit: Payer: Self-pay
